# Patient Record
Sex: Male | Born: 1965 | Race: Black or African American | Hispanic: No | Marital: Married | Smoking: Never smoker
Health system: Southern US, Community
[De-identification: ages and names within clinical notes are randomized; demographics above are authoritative.]

## PROBLEM LIST (undated history)

## (undated) DIAGNOSIS — E119 Type 2 diabetes mellitus without complications: Secondary | ICD-10-CM

## (undated) DIAGNOSIS — I10 Essential (primary) hypertension: Secondary | ICD-10-CM

## (undated) HISTORY — PX: REPLACEMENT TOTAL KNEE: SUR1224

---

## 1997-11-04 ENCOUNTER — Encounter: Admission: RE | Admit: 1997-11-04 | Discharge: 1997-11-04 | Payer: Self-pay | Admitting: Family Medicine

## 1999-07-21 ENCOUNTER — Encounter: Admission: RE | Admit: 1999-07-21 | Discharge: 1999-07-21 | Payer: Self-pay | Admitting: Sports Medicine

## 1999-11-03 ENCOUNTER — Encounter: Admission: RE | Admit: 1999-11-03 | Discharge: 1999-11-03 | Payer: Self-pay | Admitting: Family Medicine

## 2000-11-18 ENCOUNTER — Encounter: Admission: RE | Admit: 2000-11-18 | Discharge: 2000-11-18 | Payer: Self-pay | Admitting: Family Medicine

## 2000-11-22 ENCOUNTER — Ambulatory Visit (HOSPITAL_COMMUNITY): Admission: RE | Admit: 2000-11-22 | Discharge: 2000-11-22 | Payer: Self-pay | Admitting: Orthopedic Surgery

## 2002-05-08 ENCOUNTER — Encounter: Admission: RE | Admit: 2002-05-08 | Discharge: 2002-05-08 | Payer: Self-pay | Admitting: Family Medicine

## 2002-05-11 ENCOUNTER — Encounter: Admission: RE | Admit: 2002-05-11 | Discharge: 2002-05-11 | Payer: Self-pay | Admitting: Family Medicine

## 2003-01-04 ENCOUNTER — Encounter: Admission: RE | Admit: 2003-01-04 | Discharge: 2003-01-04 | Payer: Self-pay | Admitting: Family Medicine

## 2003-06-28 ENCOUNTER — Encounter: Admission: RE | Admit: 2003-06-28 | Discharge: 2003-06-28 | Payer: Self-pay | Admitting: Family Medicine

## 2004-01-27 ENCOUNTER — Ambulatory Visit: Payer: Self-pay | Admitting: Sports Medicine

## 2004-10-27 ENCOUNTER — Ambulatory Visit: Payer: Self-pay | Admitting: Family Medicine

## 2004-10-27 ENCOUNTER — Ambulatory Visit (HOSPITAL_COMMUNITY): Admission: RE | Admit: 2004-10-27 | Discharge: 2004-10-27 | Payer: Self-pay | Admitting: Family Medicine

## 2005-06-08 ENCOUNTER — Ambulatory Visit: Payer: Self-pay | Admitting: Family Medicine

## 2006-04-04 DIAGNOSIS — K219 Gastro-esophageal reflux disease without esophagitis: Secondary | ICD-10-CM | POA: Insufficient documentation

## 2006-04-04 DIAGNOSIS — J309 Allergic rhinitis, unspecified: Secondary | ICD-10-CM | POA: Insufficient documentation

## 2006-04-04 DIAGNOSIS — G5600 Carpal tunnel syndrome, unspecified upper limb: Secondary | ICD-10-CM | POA: Insufficient documentation

## 2006-04-04 DIAGNOSIS — J4520 Mild intermittent asthma, uncomplicated: Secondary | ICD-10-CM

## 2006-05-02 ENCOUNTER — Telehealth: Payer: Self-pay | Admitting: Family Medicine

## 2006-06-14 ENCOUNTER — Ambulatory Visit: Payer: Self-pay | Admitting: Family Medicine

## 2006-07-19 ENCOUNTER — Encounter: Payer: Self-pay | Admitting: Family Medicine

## 2006-07-19 ENCOUNTER — Ambulatory Visit: Payer: Self-pay | Admitting: Family Medicine

## 2006-07-19 DIAGNOSIS — I10 Essential (primary) hypertension: Secondary | ICD-10-CM | POA: Insufficient documentation

## 2006-07-19 LAB — CONVERTED CEMR LAB
BUN: 12 mg/dL (ref 6–23)
CO2: 25 meq/L (ref 19–32)
Calcium: 9.1 mg/dL (ref 8.4–10.5)
Chloride: 102 meq/L (ref 96–112)
Creatinine, Ser: 0.92 mg/dL (ref 0.40–1.50)
Direct LDL: 110 mg/dL — ABNORMAL HIGH
Glucose, Bld: 103 mg/dL — ABNORMAL HIGH (ref 70–99)
Potassium: 3.9 meq/L (ref 3.5–5.3)
Sodium: 138 meq/L (ref 135–145)

## 2006-07-22 ENCOUNTER — Encounter: Payer: Self-pay | Admitting: Family Medicine

## 2006-11-19 ENCOUNTER — Telehealth (INDEPENDENT_AMBULATORY_CARE_PROVIDER_SITE_OTHER): Payer: Self-pay | Admitting: Family Medicine

## 2006-11-21 ENCOUNTER — Telehealth: Payer: Self-pay | Admitting: *Deleted

## 2006-11-22 ENCOUNTER — Ambulatory Visit (HOSPITAL_COMMUNITY): Admission: RE | Admit: 2006-11-22 | Discharge: 2006-11-22 | Payer: Self-pay | Admitting: Family Medicine

## 2006-11-22 ENCOUNTER — Ambulatory Visit: Payer: Self-pay | Admitting: Internal Medicine

## 2006-11-22 ENCOUNTER — Encounter (INDEPENDENT_AMBULATORY_CARE_PROVIDER_SITE_OTHER): Payer: Self-pay | Admitting: Family Medicine

## 2006-11-22 LAB — CONVERTED CEMR LAB
BUN: 15 mg/dL (ref 6–23)
CO2: 26 meq/L (ref 19–32)
Calcium: 9.1 mg/dL (ref 8.4–10.5)
Chloride: 101 meq/L (ref 96–112)
Creatinine, Ser: 0.86 mg/dL (ref 0.40–1.50)
Glucose, Bld: 83 mg/dL (ref 70–99)
Potassium: 3.7 meq/L (ref 3.5–5.3)
Sodium: 140 meq/L (ref 135–145)

## 2006-11-27 ENCOUNTER — Encounter (INDEPENDENT_AMBULATORY_CARE_PROVIDER_SITE_OTHER): Payer: Self-pay | Admitting: Family Medicine

## 2007-03-05 ENCOUNTER — Telehealth: Payer: Self-pay | Admitting: *Deleted

## 2007-08-07 ENCOUNTER — Ambulatory Visit: Payer: Self-pay | Admitting: Family Medicine

## 2007-10-10 ENCOUNTER — Encounter (INDEPENDENT_AMBULATORY_CARE_PROVIDER_SITE_OTHER): Payer: Self-pay | Admitting: Family Medicine

## 2007-10-10 ENCOUNTER — Ambulatory Visit: Payer: Self-pay | Admitting: Family Medicine

## 2007-10-10 LAB — CONVERTED CEMR LAB
Alkaline Phosphatase: 49 units/L (ref 39–117)
BUN: 19 mg/dL (ref 6–23)
Creatinine, Ser: 1.29 mg/dL (ref 0.40–1.50)
Glucose, Bld: 96 mg/dL (ref 70–99)
Sodium: 142 meq/L (ref 135–145)
Total Bilirubin: 0.4 mg/dL (ref 0.3–1.2)
Total Protein: 7.7 g/dL (ref 6.0–8.3)

## 2007-10-14 ENCOUNTER — Encounter (INDEPENDENT_AMBULATORY_CARE_PROVIDER_SITE_OTHER): Payer: Self-pay | Admitting: Family Medicine

## 2007-10-17 ENCOUNTER — Telehealth (INDEPENDENT_AMBULATORY_CARE_PROVIDER_SITE_OTHER): Payer: Self-pay | Admitting: Family Medicine

## 2008-10-28 ENCOUNTER — Encounter: Payer: Self-pay | Admitting: Family Medicine

## 2008-10-29 ENCOUNTER — Ambulatory Visit: Payer: Self-pay | Admitting: Family Medicine

## 2008-10-29 ENCOUNTER — Encounter: Payer: Self-pay | Admitting: Family Medicine

## 2008-10-29 LAB — CONVERTED CEMR LAB
CO2: 24 meq/L (ref 19–32)
Chloride: 99 meq/L (ref 96–112)
Glucose, Bld: 87 mg/dL (ref 70–99)
Potassium: 3.7 meq/L (ref 3.5–5.3)
Sodium: 138 meq/L (ref 135–145)

## 2008-12-03 ENCOUNTER — Ambulatory Visit: Payer: Self-pay | Admitting: Family Medicine

## 2008-12-27 ENCOUNTER — Telehealth: Payer: Self-pay | Admitting: *Deleted

## 2009-01-25 ENCOUNTER — Telehealth: Payer: Self-pay | Admitting: *Deleted

## 2009-07-15 ENCOUNTER — Ambulatory Visit: Payer: Self-pay | Admitting: Family Medicine

## 2010-03-09 NOTE — Assessment & Plan Note (Signed)
Summary: URI?,df   Vital Signs:  Patient profile:   45 year old male Height:      72 inches Weight:      316 pounds BMI:     43.01 Temp:     99.3 degrees F oral Pulse rate:   94 / minute BP sitting:   131 / 90  (left arm) Cuff size:   large  Vitals Entered By: Tessie Fass CMA (July 15, 2009 2:11 PM) CC: fever, not feeling well Is Patient Diabetic? No Pain Assessment Patient in pain? no        Primary Care Provider:  Doree Albee MD  CC:  fever and not feeling well.  History of Present Illness: 2 weeks of URI symptoms, getting better.  Initially had fever, then cough and congestion and aches.  He really did not miss work, left early to come to the doctors.  Needs asthma meds refilled.  Habits & Providers  Alcohol-Tobacco-Diet     Tobacco Status: never  Current Medications (verified): 1)  Hydrochlorothiazide 25 Mg Tabs (Hydrochlorothiazide) .... Take 1 Tablet By Mouth Once A Day 2)  Ranitidine Hcl 150 Mg  Tabs (Ranitidine Hcl) .Marland Kitchen.. 1 Tab By Mouth Two Times A Day For Heartburn 3)  Ventolin Hfa 108 (90 Base) Mcg/act Aers (Albuterol Sulfate) .Marland Kitchen.. 1-2 Inhaled Every 4 Hours As Needed For Wheezing. 4)  Flovent Hfa 110 Mcg/act Aero (Fluticasone Propionate  Hfa) .Marland Kitchen.. 1 Puff Two Times A Day Every Day For Control of Asthma- Not A Rescue Inhaler!  Allergies (verified): No Known Drug Allergies  Physical Exam  General:  Well-developed,well-nourished,in no acute distress; alert,appropriate and cooperative throughout examination Ears:  TM retracted Mouth:  white post nasal drainage Lungs:  normal respiratory effort and normal breath sounds.   Heart:  normal rate and regular rhythm.     Impression & Recommendations:  Problem # 1:  UPPER RESPIRATORY INFECTION (ICD-465.9)  almost resolved, refilled asthma meds, no additonal treatment necessary  Orders: [facility]- Est Level  3 ([address]109)  Problem # 2:  HYPERTENSION, BENIGN (ICD-401.1)  refilled His updated medication list for  this problem includes:    Hydrochlorothiazide 25 Mg Tabs (Hydrochlorothiazide) .Marland Kitchen... Take 1 tablet by mouth once a day  Orders: [facility]- Est Level  3 (60454)  Problem # 3:  GASTROESOPHAGEAL REFLUX, NO ESOPHAGITIS (ICD-530.81)  refilled His updated medication list for this problem includes:    Ranitidine Hcl 150 Mg Tabs (Ranitidine hcl) .Marland Kitchen... 1 tab by mouth two times a day for heartburn  Orders: [facility]- Est Level  3 (09811)  Complete Medication List: 1)  Hydrochlorothiazide 25 Mg Tabs (Hydrochlorothiazide) .... Take 1 tablet by mouth once a day 2)  Ranitidine Hcl 150 Mg Tabs (Ranitidine hcl) .Marland Kitchen.. 1 tab by mouth two times a day for heartburn 3)  Ventolin Hfa 108 (90 Base) Mcg/act Aers (Albuterol sulfate) .Marland Kitchen.. 1-2 inhaled every 4 hours as needed for wheezing. 4)  Flovent Hfa 110 Mcg/act Aero (Fluticasone propionate  hfa) .Marland Kitchen.. 1 puff two times a day every day for control of asthma- not a rescue inhaler!  Patient Instructions: 1)  Please schedule a follow-up appointment as needed .  Prescriptions: RANITIDINE HCL 150 MG  TABS (RANITIDINE HCL) 1 tab by mouth two times a day for heartburn  #180 x 3   Entered and Authorized by:   Luretha Murphy NP   Signed by:   Luretha Murphy NP on 07/15/2009   Method used:   [facility]ronically to  [facility]       [address]       [place], [place]  [address]       Ph: [phone]       Fax: [phone]   RxID:   [id number] HYDROCHLOROTHIAZIDE 25 MG TABS (HYDROCHLOROTHIAZIDE) Take 1 tablet by mouth once a day  #90 x 3   Entered and Authorized by:   Luretha Murphy NP   Signed by:   Luretha Murphy NP on 07/15/2009   Method used:   Electronically to        [facility] (retail)       [address]       [place], [place]  [address]       Ph: [phone]       Fax: [phone]   RxID:   [id number] FLOVENT HFA 110 MCG/ACT AERO (FLUTICASONE PROPIONATE  HFA) 1 puff two times a day every  day for control of asthma- not a rescue inhaler!  #1 x 3   Entered and Authorized by:   Luretha Murphy NP   Signed by:   Luretha Murphy NP on 07/15/2009   Method used:   Electronically to        [facility] (retail)       [address]       [place], [place]  [address]       Ph: [phone]       Fax: [phone]   RxID:   [id number] VENTOLIN HFA 108 (90 BASE) MCG/ACT AERS (ALBUTEROL SULFATE) 1-2 inhaled every 4 hours as needed for wheezing.  #1 x 3   Entered and Authorized by:   Luretha Murphy NP   Signed by:   Luretha Murphy NP on 07/15/2009   Method used:   Electronically to        [facility] (retail)       [address]       [place], [place]  [address]       Ph: [phone]       Fax: [phone]   RxID:   [id number]

## 2010-08-15 ENCOUNTER — Other Ambulatory Visit: Payer: Self-pay | Admitting: Family Medicine

## 2010-08-16 NOTE — Telephone Encounter (Signed)
Refill request

## 2010-08-18 ENCOUNTER — Telehealth: Payer: Self-pay | Admitting: Family Medicine

## 2010-08-18 NOTE — Telephone Encounter (Signed)
Pt is out of flovent, is out of town and wants to know if it can be refilled at the pharmacy he is close to, [facility] [place], [place] [phone].

## 2010-08-22 MED ORDER — FLUTICASONE PROPIONATE HFA 110 MCG/ACT IN AERO: 1.0000 | INHALATION_SPRAY | Freq: Two times a day (BID) | RESPIRATORY_TRACT | Status: DC

## 2010-08-22 NOTE — Telephone Encounter (Signed)
Pt is checking to see if he can get this flonase.  He wants it called to [facility] - [phone]

## 2010-08-22 NOTE — Telephone Encounter (Signed)
Called in with no refills.  Patient notified.

## 2010-10-12 ENCOUNTER — Other Ambulatory Visit: Payer: Self-pay | Admitting: Family Medicine

## 2010-10-12 NOTE — Telephone Encounter (Signed)
Refill request

## 2010-11-10 ENCOUNTER — Other Ambulatory Visit: Payer: Self-pay | Admitting: Family Medicine

## 2010-11-10 NOTE — Telephone Encounter (Signed)
Refill request

## 2010-11-13 NOTE — Telephone Encounter (Signed)
Pt needs appt for any additional refills

## 2011-05-09 ENCOUNTER — Other Ambulatory Visit: Payer: Self-pay | Admitting: Family Medicine

## 2011-06-15 ENCOUNTER — Ambulatory Visit (INDEPENDENT_AMBULATORY_CARE_PROVIDER_SITE_OTHER): Payer: BC Managed Care – PPO | Admitting: Family Medicine

## 2011-06-15 ENCOUNTER — Encounter: Payer: Self-pay | Admitting: Family Medicine

## 2011-06-15 VITALS — BP 152/85 | HR 106 | Temp 98.9°F | Ht 72.0 in | Wt 334.0 lb

## 2011-06-15 DIAGNOSIS — I10 Essential (primary) hypertension: Secondary | ICD-10-CM

## 2011-06-15 DIAGNOSIS — J45909 Unspecified asthma, uncomplicated: Secondary | ICD-10-CM

## 2011-06-15 MED ORDER — HYDROCHLOROTHIAZIDE 25 MG PO TABS: 25.0000 mg | ORAL_TABLET | Freq: Every day | ORAL | Status: DC

## 2011-06-15 MED ORDER — FLUTICASONE PROPIONATE HFA 110 MCG/ACT IN AERO: 1.0000 | INHALATION_SPRAY | Freq: Two times a day (BID) | RESPIRATORY_TRACT | Status: DC

## 2011-06-15 MED ORDER — LISINOPRIL 10 MG PO TABS: 10.0000 mg | ORAL_TABLET | Freq: Every day | ORAL | Status: DC

## 2011-06-15 MED ORDER — ALBUTEROL SULFATE HFA 108 (90 BASE) MCG/ACT IN AERS: 2.0000 | INHALATION_SPRAY | Freq: Four times a day (QID) | RESPIRATORY_TRACT | Status: DC | PRN

## 2011-06-15 NOTE — Patient Instructions (Signed)

## 2011-06-16 LAB — TSH: TSH: 0.739 u[IU]/mL (ref 0.350–4.500)

## 2011-06-16 LAB — COMPREHENSIVE METABOLIC PANEL
AST: 14 U/L (ref 0–37)
BUN: 18 mg/dL (ref 6–23)
Calcium: 9.7 mg/dL (ref 8.4–10.5)
Chloride: 102 mEq/L (ref 96–112)
Creat: 0.97 mg/dL (ref 0.50–1.35)

## 2011-06-16 NOTE — Assessment & Plan Note (Addendum)
Elevated today. Will add on lisinopril to regimen. Will check Cr and K. Discussed low salt diet as well as importance of more regular follow up. Handout given. Pt expressed understanding.  Given acanthois and obesity, will check risk stratification labs

## 2011-06-16 NOTE — Progress Notes (Signed)
Subjective:  Pt presents today for chronic problem. Pt has not been seen since 2010 for this.   Hypertension:   Checking BPS Daily ?:intermittently  BP Range:140s per pt  Any HA, CP, SOB?:no Any Medication Side Effects:no Medication Compliance:yes Salt intake?:high Exercise?:no Weight Loss?:no  Asthma: on flovent and albuterol. Using flovent daily. Only uses albuterol intermittently. Usually twice per week.         Review of Systems - Negative except as noted as noted above in HPI   Objective:  Current Outpatient Prescriptions  Medication Sig Dispense Refill  . albuterol (VENTOLIN HFA) 108 (90 BASE) MCG/ACT inhaler Inhale 2 puffs into the lungs every 6 (six) hours as needed for wheezing. 1-2 puffs inhaled every 4 hours as needed for wheezing.  8.5 g  3  . fluticasone (FLOVENT HFA) 110 MCG/ACT inhaler Inhale 1 puff into the lungs 2 (two) times daily.  1 Inhaler  6  . hydrochlorothiazide (HYDRODIURIL) 25 MG tablet Take 1 tablet (25 mg total) by mouth daily.  30 tablet  6  . lisinopril (PRINIVIL,ZESTRIL) 10 MG tablet Take 1 tablet (10 mg total) by mouth daily.  30 tablet  11  . ranitidine (ZANTAC) 150 MG tablet TAKE ONE TABLET BY MOUTH TWICE DAILY FOR  HEARTBURN  60 tablet  11    Wt Readings from Last 3 Encounters:  06/15/11 334 lb (151.501 kg)  07/15/09 316 lb (143.337 kg)  12/03/08 313 lb (141.976 kg)   Temp Readings from Last 3 Encounters:  06/15/11 98.9 F (37.2 C) Oral   BP Readings from Last 3 Encounters:  06/15/11 152/85  07/15/09 131/90  12/03/08 130/80   Pulse Readings from Last 3 Encounters:  06/15/11 106  07/15/09 94  12/03/08 78   General: alert, cooperative and moderately obese HEENT: PERRLA and extra ocular movement intact Heart: S1, S2 normal, no murmur, rub or gallop, regular rate and rhythm Lungs: clear to auscultation, no wheezes or rales and unlabored breathing Abdomen: abdomen is soft without significant tenderness, masses, organomegaly or  guarding Extremities: extremities normal, atraumatic, no cyanosis or edema Skin:+ acanthosis  Neurology: normal without focal findings   Assessment/Plan:

## 2011-06-16 NOTE — Assessment & Plan Note (Signed)
Well controlled on current regimen. Will continue.  

## 2011-06-20 ENCOUNTER — Telehealth: Payer: Self-pay | Admitting: Family Medicine

## 2011-06-20 ENCOUNTER — Encounter: Payer: Self-pay | Admitting: Family Medicine

## 2011-06-20 DIAGNOSIS — E785 Hyperlipidemia, unspecified: Secondary | ICD-10-CM

## 2011-06-20 DIAGNOSIS — R7303 Prediabetes: Secondary | ICD-10-CM | POA: Insufficient documentation

## 2011-06-20 MED ORDER — METFORMIN HCL 500 MG PO TABS: 500.0000 mg | ORAL_TABLET | Freq: Every day | ORAL | Status: DC

## 2011-06-20 MED ORDER — PRAVASTATIN SODIUM 40 MG PO TABS: 40.0000 mg | ORAL_TABLET | Freq: Every day | ORAL | Status: DC

## 2011-06-20 NOTE — Telephone Encounter (Signed)
Called to follow up about lab results including A1C and LDL. Discussed with pt that I would like to start him on medication for both elevated blood sugar and elevated cholesterol. Pt was agreeable to this. Pt has follow up appt in 3-4 weeks.

## 2011-07-27 ENCOUNTER — Encounter: Payer: Self-pay | Admitting: Family Medicine

## 2011-07-27 ENCOUNTER — Ambulatory Visit (INDEPENDENT_AMBULATORY_CARE_PROVIDER_SITE_OTHER): Payer: BC Managed Care – PPO | Admitting: Family Medicine

## 2011-07-27 VITALS — BP 134/80 | HR 88 | Ht 72.0 in | Wt 336.0 lb

## 2011-07-27 DIAGNOSIS — E785 Hyperlipidemia, unspecified: Secondary | ICD-10-CM

## 2011-07-27 DIAGNOSIS — I10 Essential (primary) hypertension: Secondary | ICD-10-CM

## 2011-07-27 DIAGNOSIS — R7309 Other abnormal glucose: Secondary | ICD-10-CM

## 2011-07-27 DIAGNOSIS — R7303 Prediabetes: Secondary | ICD-10-CM

## 2011-07-27 NOTE — Progress Notes (Signed)
Subjective:  Pt here for htn follow up HTN:  Checking BPS Daily ?:no; intermittently  BP Range:140s Any HA, CP, SOB?:no Any Medication Side Effects:no Medication Compliance:yes Salt intake?:decreased per pt Exercise?:intermittent  Weight Loss?:no  Lab Results  Component Value Date   HGBA1C 6.4 06/15/2011  -Currently on metformin 500mg  daily.  -Next A1C due 09/2011         Review of Systems - Negative except as noted above in HPI  Objective:  Current Outpatient Prescriptions  Medication Sig Dispense Refill  . albuterol (VENTOLIN HFA) 108 (90 BASE) MCG/ACT inhaler Inhale 2 puffs into the lungs every 6 (six) hours as needed for wheezing. 1-2 puffs inhaled every 4 hours as needed for wheezing.  8.5 g  3  . fluticasone (FLOVENT HFA) 110 MCG/ACT inhaler Inhale 1 puff into the lungs 2 (two) times daily.  1 Inhaler  6  . hydrochlorothiazide (HYDRODIURIL) 25 MG tablet Take 1 tablet (25 mg total) by mouth daily.  30 tablet  6  . lisinopril (PRINIVIL,ZESTRIL) 10 MG tablet Take 1 tablet (10 mg total) by mouth daily.  30 tablet  11  . metFORMIN (GLUCOPHAGE) 500 MG tablet Take 1 tablet (500 mg total) by mouth daily with breakfast.  180 tablet  3  . pravastatin (PRAVACHOL) 40 MG tablet Take 1 tablet (40 mg total) by mouth daily.  90 tablet  3  . ranitidine (ZANTAC) 150 MG tablet TAKE ONE TABLET BY MOUTH TWICE DAILY FOR  HEARTBURN  60 tablet  11    Wt Readings from Last 3 Encounters:  07/27/11 336 lb (152.409 kg)  06/15/11 334 lb (151.501 kg)  07/15/09 316 lb (143.337 kg)   Temp Readings from Last 3 Encounters:  06/15/11 98.9 F (37.2 C) Oral   BP Readings from Last 3 Encounters:  07/27/11 134/80  06/15/11 152/85  07/15/09 131/90   Pulse Readings from Last 3 Encounters:  07/27/11 88  06/15/11 106  07/15/09 94   General: alert, cooperative and moderately obese HEENT: PERRLA and extra ocular movement intact Heart: S1, S2 normal, no murmur, rub or gallop, regular rate and  rhythm Lungs: clear to auscultation, no wheezes or rales and unlabored breathing Abdomen: abdomen is soft without significant tenderness, masses, organomegaly or guarding Extremities: extremities normal, atraumatic, no cyanosis or edema Skin:no rashes Neurology: normal without focal findings   Assessment/Plan:

## 2011-07-27 NOTE — Patient Instructions (Signed)

## 2011-07-27 NOTE — Assessment & Plan Note (Signed)
On pravachol.  Next LDL due 12/2011

## 2011-07-27 NOTE — Assessment & Plan Note (Signed)
Continue metformin. Discussed diet and exercise.

## 2011-07-27 NOTE — Assessment & Plan Note (Signed)
Improved with HCTZ and lisinopril. Continue current regimen. WIll check Cr and K today.

## 2011-07-28 LAB — BASIC METABOLIC PANEL
CO2: 27 mEq/L (ref 19–32)
Calcium: 9.5 mg/dL (ref 8.4–10.5)
Creat: 1.03 mg/dL (ref 0.50–1.35)
Sodium: 138 mEq/L (ref 135–145)

## 2011-08-03 ENCOUNTER — Ambulatory Visit: Payer: BC Managed Care – PPO | Admitting: Family Medicine

## 2011-10-19 ENCOUNTER — Telehealth: Payer: Self-pay | Admitting: Family Medicine

## 2011-10-19 DIAGNOSIS — K219 Gastro-esophageal reflux disease without esophagitis: Secondary | ICD-10-CM

## 2011-10-19 MED ORDER — RANITIDINE HCL 150 MG PO TABS: 150.0000 mg | ORAL_TABLET | Freq: Two times a day (BID) | ORAL | Status: DC

## 2011-10-19 NOTE — Telephone Encounter (Signed)
Ranitidine Rx in Epic was from 10/12/2010.  Last office visit with Dr. Alvester Morin in 07/2011.  Will refill med x 1 and patient needs appt with new PCP.   Patient verbalized understanding.  Gaylene Brooks, RN

## 2011-10-19 NOTE — Telephone Encounter (Signed)
The Rx for Ranitidine was not received by [facility] on [address] from last Friday 9/6 and he needs it to be resent as soon as possible.

## 2011-12-21 ENCOUNTER — Encounter: Payer: Self-pay | Admitting: Family Medicine

## 2011-12-21 ENCOUNTER — Ambulatory Visit (INDEPENDENT_AMBULATORY_CARE_PROVIDER_SITE_OTHER): Payer: BC Managed Care – PPO | Admitting: Family Medicine

## 2011-12-21 VITALS — BP 136/84 | HR 94 | Ht 72.0 in | Wt 331.0 lb

## 2011-12-21 DIAGNOSIS — R7309 Other abnormal glucose: Secondary | ICD-10-CM

## 2011-12-21 DIAGNOSIS — K219 Gastro-esophageal reflux disease without esophagitis: Secondary | ICD-10-CM

## 2011-12-21 DIAGNOSIS — I10 Essential (primary) hypertension: Secondary | ICD-10-CM

## 2011-12-21 DIAGNOSIS — N529 Male erectile dysfunction, unspecified: Secondary | ICD-10-CM

## 2011-12-21 DIAGNOSIS — E785 Hyperlipidemia, unspecified: Secondary | ICD-10-CM

## 2011-12-21 DIAGNOSIS — R7303 Prediabetes: Secondary | ICD-10-CM

## 2011-12-21 LAB — POCT GLYCOSYLATED HEMOGLOBIN (HGB A1C): Hemoglobin A1C: 6.3

## 2011-12-21 MED ORDER — SILDENAFIL CITRATE 100 MG PO TABS: 50.0000 mg | ORAL_TABLET | Freq: Every day | ORAL | Status: DC | PRN

## 2011-12-21 MED ORDER — HYDROCHLOROTHIAZIDE 25 MG PO TABS: 25.0000 mg | ORAL_TABLET | Freq: Every day | ORAL | Status: DC

## 2011-12-21 MED ORDER — FLUTICASONE PROPIONATE HFA 110 MCG/ACT IN AERO: 1.0000 | INHALATION_SPRAY | Freq: Two times a day (BID) | RESPIRATORY_TRACT | Status: DC

## 2011-12-21 MED ORDER — DICLOFENAC SODIUM 75 MG PO TBEC: 75.0000 mg | DELAYED_RELEASE_TABLET | Freq: Two times a day (BID) | ORAL | Status: DC

## 2011-12-21 MED ORDER — RANITIDINE HCL 150 MG PO TABS: 150.0000 mg | ORAL_TABLET | Freq: Two times a day (BID) | ORAL | Status: DC

## 2011-12-21 NOTE — Patient Instructions (Signed)
Sildenafil tablets (Viagra) What is this medicine? SILDENAFIL (sil DEN a fil) is used to treat erection problems in men. This medicine may be used for other purposes; ask your health care provider or pharmacist if you have questions. What should I tell my health care provider before I take this medicine? They need to know if you have any of these conditions: -bleeding disorders -eye or vision problems, including a rare inherited eye disease called retinitis pigmentosa -anatomical deformation of the penis, Peyronie's disease, or history of priapism (painful and prolonged erection) -heart disease, angina, a history of heart attack, irregular heart beats, or other heart problems -high or low blood pressure -history of blood diseases, like sickle cell anemia or leukemia -history of stomach bleeding -kidney disease -liver disease -stroke -an unusual or allergic reaction to sildenafil, other medicines, foods, dyes, or preservatives -pregnant or trying to get pregnant -breast-feeding How should I use this medicine? Take this medicine by mouth with a glass of water. Follow the directions on the prescription label. The dose is usually taken 1 hour before sexual activity. You should not take the dose more than once per day. Do not take your medicine more often than directed. Talk to your pediatrician regarding the use of this medicine in children. This medicine is not used in children for this condition. Overdosage: If you think you have taken too much of this medicine contact a poison control center or emergency room at once. NOTE: This medicine is only for you. Do not share this medicine with others. What if I miss a dose? This does not apply. Do not take double or extra doses. What may interact with this medicine? Do not take this medicine with any of the following medications: -cisapride -methscopolamine nitrate -nitrates like amyl nitrite, isosorbide dinitrate, isosorbide mononitrate,  nitroglycerin -nitroprusside -other medicines for erectile dysfunction like avanafil, tadalafil, vardenafil -other sildenafil products (Revatio)  This medicine may also interact with the following medications: -certain drugs for high blood pressure -certain drugs for the treatment of HIV infection or AIDS -certain drugs used for fungal or yeast infections, like fluconazole, itraconazole, ketoconazole, and voriconazole -cimetidine -erythromycin -rifampin This list may not describe all possible interactions. Give your health care provider a list of all the medicines, herbs, non-prescription drugs, or dietary supplements you use. Also tell them if you smoke, drink alcohol, or use illegal drugs. Some items may interact with your medicine. What should I watch for while using this medicine? If you notice any changes in your vision while taking this drug, call your doctor or health care professional as soon as possible. Stop using this medicine and call your health care provider right away if you have a loss of sight in one or both eyes. Contact you doctor or health care professional right away if the erection lasts longer than 4 hours or if it becomes painful. This may be a sign of serious problem and must be treated right away to prevent permanent damage. If you experience symptoms of nausea, dizziness, chest pain or arm pain upon initiation of sexual activity after taking this medicine, you should refrain from further activity and call your doctor or health care professional as soon as possible. Do not drink alcohol to excess (examples, 5 glasses of wine or 5 shots of whiskey) when taking this medicine. When taken in excess, alcohol can increase your chances of getting a headache or getting dizzy, increasing your heart rate or lowering your blood pressure. Using this medicine does not protect you  or your partner against HIV infection (the virus that causes AIDS) or other sexually transmitted  diseases. What side effects may I notice from receiving this medicine? Side effects that you should report to your doctor or health care professional as soon as possible: -allergic reactions like skin rash, itching or hives, swelling of the face, lips, or tongue -breathing problems -changes in hearing -changes in vision -chest pain -erection lasting more than 4 hours -fast, irregular heartbeat -seizures  Side effects that usually do not require medical attention (report to your doctor or health care professional if they continue or are bothersome): -back pain -dizziness -flushing -headache -indigestion -muscle aches -nausea -stuffy or runny nose This list may not describe all possible side effects. Call your doctor for medical advice about side effects. You may report side effects to FDA at 1-800-FDA-1088. Where should I keep my medicine? Keep out of reach of children. Store at room temperature between 15 and 30 degrees C (59 and 86 degrees F). Throw away any unused medicine after the expiration date. NOTE: This sheet is a summary. It may not cover all possible information. If you have questions about this medicine, talk to your doctor, pharmacist, or health care provider.  2012, [employer] Standard. (07/18/2010 1:17:06 PM)

## 2011-12-22 LAB — COMPLETE METABOLIC PANEL WITH GFR
ALT: 19 U/L (ref 0–53)
AST: 13 U/L (ref 0–37)
Albumin: 4.3 g/dL (ref 3.5–5.2)
Alkaline Phosphatase: 48 U/L (ref 39–117)
BUN: 17 mg/dL (ref 6–23)
Calcium: 9.2 mg/dL (ref 8.4–10.5)
Chloride: 100 mEq/L (ref 96–112)
Potassium: 4 mEq/L (ref 3.5–5.3)
Sodium: 135 mEq/L (ref 135–145)

## 2011-12-22 LAB — LDL CHOLESTEROL, DIRECT: Direct LDL: 113 mg/dL — ABNORMAL HIGH

## 2011-12-24 NOTE — Progress Notes (Signed)
Patient ID: Bryan Simmons    DOB: Sep 16, 1965, 46 y.o.   MRN: [id number] --- Subjective:  Bryan Simmons is a 46 y.o.male who presents for med refills. Patient's main concern is his inability to sustain erection for longer than a few seconds. He feels like this sarted when he first started lisinopril and HCTz. He reports still having sex drive and denies this ever happening to him before.   - HTN: takes CTZ and lisinopril as prescribed without side effects. Checks his BP at home and it runs in the 130's/80's. He denies any chest pain, any shortness of breath, any lower extremity swelling.   - pre-diabetes: was diagnosed with pre-diabetes for which he was started on metformin. He denies any change in vision, loss of sensation, ulcers, polydipsia, polyuria.   ROS: see HPI Past Medical History: reviewed and updated medications and allergies. Social History: Tobacco: denies  Objective: Filed Vitals:   12/21/11 1347  BP: 136/84  Pulse: 94    Physical Examination:   General appearance - alert, well appearing, and in no distress Chest - clear to auscultation, no wheezes, rales or rhonchi, symmetric air entry Heart - normal rate, regular rhythm, normal S1, S2, no murmurs, rubs, clicks or gallops Abdomen - soft, nontender, nondistended, no masses or organomegaly Extremities - peripheral pulses normal, no pedal edema

## 2011-12-25 DIAGNOSIS — N529 Male erectile dysfunction, unspecified: Secondary | ICD-10-CM | POA: Insufficient documentation

## 2011-12-25 NOTE — Assessment & Plan Note (Signed)
Direct LDL: 113. Goal for prediabetes ideally less than 100. Consider switching to more potent statin like atorvastatin for better results. However, patient trying to loose weight and may have success with diet modifications.

## 2011-12-25 NOTE — Assessment & Plan Note (Signed)
Unlikely due to diabetes given that patient is only pre-diabetic. Likely idiopathic. No history of heart disease or nitrate use which would put him at risk of starting phosphodiesterase inhibitor. Will start viagra. HOwever, did warn patient that patient's weight puts him at risk of cardiovascular event and that if he gains weight as opposed to continuing to loose, will need to reconsider viagra prescription. Patient expressed understanding.

## 2011-12-25 NOTE — Assessment & Plan Note (Signed)
Controled. Refilled lisnopril 10mg  and HCTZ 25mg . Obtain BMP to assess electrolytes.

## 2011-12-25 NOTE — Assessment & Plan Note (Signed)
A1C: 6.3. Continue metformin 500mg  daily

## 2011-12-27 ENCOUNTER — Telehealth: Payer: Self-pay | Admitting: Family Medicine

## 2011-12-27 DIAGNOSIS — E785 Hyperlipidemia, unspecified: Secondary | ICD-10-CM

## 2011-12-27 MED ORDER — PRAVASTATIN SODIUM 80 MG PO TABS: 80.0000 mg | ORAL_TABLET | Freq: Every day | ORAL | Status: DC

## 2011-12-27 NOTE — Telephone Encounter (Signed)
Called patient and left message on his phone letting him know of his LDL result. Not at goal below 100. Will increase pravastatin dose. Encouraged continued diet and exercise. If this doesn't decrease LDL, will switch to lipitor. Patient to follow up in 3 months.

## 2012-05-07 ENCOUNTER — Telehealth: Payer: Self-pay | Admitting: Family Medicine

## 2012-05-07 DIAGNOSIS — K219 Gastro-esophageal reflux disease without esophagitis: Secondary | ICD-10-CM

## 2012-05-07 MED ORDER — RANITIDINE HCL 150 MG PO TABS: 150.0000 mg | ORAL_TABLET | Freq: Two times a day (BID) | ORAL | Status: DC

## 2012-05-07 NOTE — Telephone Encounter (Signed)
Pt has been having a hard time getting his refill on his Zantac - pharmacy states they have sent several requests - needs to know if this can be called in.  Has appt on 4/25 [facility]

## 2012-05-07 NOTE — Telephone Encounter (Signed)
Called and informed patient I sent in refill to his pharmacy.Dorn Hartshorne, Rodena Medin

## 2012-05-30 ENCOUNTER — Ambulatory Visit: Payer: BC Managed Care – PPO | Admitting: Family Medicine

## 2012-07-11 ENCOUNTER — Ambulatory Visit: Payer: BC Managed Care – PPO | Admitting: Family Medicine

## 2012-07-18 ENCOUNTER — Ambulatory Visit (INDEPENDENT_AMBULATORY_CARE_PROVIDER_SITE_OTHER): Payer: BC Managed Care – PPO | Admitting: Family Medicine

## 2012-07-18 ENCOUNTER — Encounter: Payer: Self-pay | Admitting: Family Medicine

## 2012-07-18 VITALS — BP 125/85 | HR 88 | Ht 72.0 in | Wt 318.8 lb

## 2012-07-18 DIAGNOSIS — K219 Gastro-esophageal reflux disease without esophagitis: Secondary | ICD-10-CM

## 2012-07-18 DIAGNOSIS — R7309 Other abnormal glucose: Secondary | ICD-10-CM

## 2012-07-18 DIAGNOSIS — E785 Hyperlipidemia, unspecified: Secondary | ICD-10-CM

## 2012-07-18 DIAGNOSIS — N529 Male erectile dysfunction, unspecified: Secondary | ICD-10-CM

## 2012-07-18 DIAGNOSIS — I10 Essential (primary) hypertension: Secondary | ICD-10-CM

## 2012-07-18 DIAGNOSIS — R7303 Prediabetes: Secondary | ICD-10-CM

## 2012-07-18 LAB — POCT GLYCOSYLATED HEMOGLOBIN (HGB A1C): Hemoglobin A1C: 6.1

## 2012-07-18 MED ORDER — PRAVASTATIN SODIUM 80 MG PO TABS: 80.0000 mg | ORAL_TABLET | Freq: Every day | ORAL | Status: DC

## 2012-07-18 MED ORDER — DICLOFENAC SODIUM 75 MG PO TBEC: 75.0000 mg | DELAYED_RELEASE_TABLET | Freq: Two times a day (BID) | ORAL | Status: DC

## 2012-07-18 MED ORDER — RANITIDINE HCL 150 MG PO TABS: 150.0000 mg | ORAL_TABLET | Freq: Two times a day (BID) | ORAL | Status: DC

## 2012-07-18 MED ORDER — LISINOPRIL 10 MG PO TABS: 10.0000 mg | ORAL_TABLET | Freq: Every day | ORAL | Status: DC

## 2012-07-18 MED ORDER — HYDROCHLOROTHIAZIDE 25 MG PO TABS: 25.0000 mg | ORAL_TABLET | Freq: Every day | ORAL | Status: DC

## 2012-07-18 MED ORDER — SILDENAFIL CITRATE 100 MG PO TABS: 50.0000 mg | ORAL_TABLET | Freq: Every day | ORAL | Status: DC | PRN

## 2012-07-18 NOTE — Patient Instructions (Addendum)
When you come back in November (5 months from now), come for an early appointment fasting (no food or drink other than water after midnight) and we'll do blood work at that time.

## 2012-07-20 NOTE — Assessment & Plan Note (Signed)
On lisiopril 10mg  and hctz 25mg  daily. Well controled. Continue both.  Will obtain yearly BMP in NOvember. Patient to follow up in 5 months.

## 2012-07-20 NOTE — Progress Notes (Signed)
Patient ID: Bryan Simmons    DOB: 1965/06/12, 47 y.o.   MRN: [id number] --- Subjective:  Bryan Simmons is a 47 y.o.male who presents for follow up on HTN and pre-diabetes.  - prediabetes: taking metformin 500mg  daily. Also has lost 18 lbs since last June. He reports eating more salads and not drinking any sodas or sweet drinks. He has not started exercise program but plans on it in the near future.  He denies any change in vision, lower extremity numbness or tingling. No polydypsia or polyuria.   - HTN: takes hctz 25mg  daily and lisinopril 10mg  daily. When he checks BP at pharmacy: runs in 120's No chest pain, no shortness of breath, no lower extremity swelling, lays flat.    ROS: see HPI Past Medical History: reviewed and updated medications and allergies. Social History: Tobacco: denies   Objective: Filed Vitals:   07/18/12 1117  BP: 125/85  Pulse: 88    Physical Examination:   General appearance - alert, well appearing, and in no distress Ears - bilateral TM's and external ear canals normal Nose - normal and patent, mildly congested nasal turbinates bilaterally Mouth - mucous membranes moist, pharynx normal without lesions Neck - supple, no significant adenopathy Chest - clear to auscultation, no wheezes, rales or rhonchi, symmetric air entry Heart - normal rate, regular rhythm, normal S1, S2, no murmurs, rubs, clicks or gallops Abdomen - soft, nontender, nondistended, no masses or organomegaly Extremities - peripheral pulses normal, no pedal edema

## 2012-07-20 NOTE — Assessment & Plan Note (Signed)
A1C improved with diet and weight loss. To 6.1 from 6.4. Continue metformin and diet and exercise. Repeat A1c at next visit in 5-6 months.

## 2012-07-20 NOTE — Assessment & Plan Note (Signed)
On pravachol 80mg  daily which he tolerates well. obtain fasting lipid panel in 5 months.

## 2012-07-20 NOTE — Assessment & Plan Note (Signed)
Controled with viagra. No side effects (chest pain or SOB with use). Refilled 10tabs 1refil

## 2012-08-07 ENCOUNTER — Encounter: Payer: Self-pay | Admitting: Family Medicine

## 2012-09-17 ENCOUNTER — Encounter: Payer: Self-pay | Admitting: Family Medicine

## 2012-09-18 ENCOUNTER — Encounter: Payer: Self-pay | Admitting: Family Medicine

## 2012-09-19 ENCOUNTER — Telehealth: Payer: Self-pay | Admitting: Family Medicine

## 2012-09-19 MED ORDER — PRAVASTATIN SODIUM 40 MG PO TABS: 80.0000 mg | ORAL_TABLET | Freq: Every day | ORAL | Status: DC

## 2012-09-19 NOTE — Telephone Encounter (Signed)
Refilled pravastatin 40mg  2 tablets every day, 90 tabs 3 refills.   Marena Chancy, PGY-3 Family Medicine Resident

## 2012-09-19 NOTE — Telephone Encounter (Signed)
Bryan Simmons is requesting his Prevastatin be reduced from 80mg  to 40mg .   This is for the cost of medication. It's time for renewing the refill, so he want the new rx to be for the 40.

## 2012-09-19 NOTE — Telephone Encounter (Signed)
Left message for pt - why does med need to be decreased? Due to cost? Need refill? Please clarify. Wyatt Haste, RN-BSN

## 2012-09-19 NOTE — Telephone Encounter (Signed)
Pt reports that 80 mg tablets are triple the price. can he have script for 40 mg twice a day? Wyatt Haste, RN-BSN

## 2012-09-23 ENCOUNTER — Other Ambulatory Visit: Payer: Self-pay | Admitting: Family Medicine

## 2012-09-23 MED ORDER — PRAVASTATIN SODIUM 40 MG PO TABS: 40.0000 mg | ORAL_TABLET | Freq: Every day | ORAL | Status: DC

## 2012-10-15 ENCOUNTER — Encounter: Payer: Self-pay | Admitting: Family Medicine

## 2012-10-22 ENCOUNTER — Telehealth: Payer: Self-pay | Admitting: Family Medicine

## 2012-10-22 DIAGNOSIS — R7303 Prediabetes: Secondary | ICD-10-CM

## 2012-10-22 MED ORDER — METFORMIN HCL 500 MG PO TABS: 500.0000 mg | ORAL_TABLET | Freq: Every day | ORAL | Status: DC

## 2012-10-22 NOTE — Telephone Encounter (Signed)
Will fwd. To Dr.Losq for refills. Bryan Simmons, [name]

## 2012-10-22 NOTE — Telephone Encounter (Signed)
Patient states that he requested a refill for his metFormin last Tuesday and has not received a response. He needs a refill sent to pharmacy. Also, patient states he works out of town and will not return until Friday. Will be out of meds theses three days and would to know if Dr. Gwenlyn Saran feels this will be harmful to him? Pls call patient

## 2012-10-22 NOTE — Telephone Encounter (Signed)
Called patient back and apologized for the miscommunication between the pharmacy and the clinic.  I will refill the metformin 500mg  daily. I also told him that missing a few doses of metformin is not of great concern since his A1C was 6.1 at his last visit.  Patient expressed understanding and agreed with plan.   Marena Chancy, PGY-3 Family Medicine Resident

## 2012-12-11 ENCOUNTER — Other Ambulatory Visit: Payer: Self-pay

## 2013-01-22 ENCOUNTER — Telehealth: Payer: Self-pay | Admitting: Family Medicine

## 2013-01-22 DIAGNOSIS — K219 Gastro-esophageal reflux disease without esophagitis: Secondary | ICD-10-CM

## 2013-01-22 MED ORDER — RANITIDINE HCL 150 MG PO TABS: 150.0000 mg | ORAL_TABLET | Freq: Two times a day (BID) | ORAL | Status: DC

## 2013-01-22 NOTE — Telephone Encounter (Signed)
Refilled zantac  Marena Chancy, PGY-3 Family Medicine Resident

## 2013-01-22 NOTE — Telephone Encounter (Signed)
Pt is calling and needs a refill on his Zantac sent to his pharmacy on file. jw

## 2013-03-20 ENCOUNTER — Telehealth: Payer: Self-pay | Admitting: Family Medicine

## 2013-03-20 MED ORDER — HYDROCHLOROTHIAZIDE 25 MG PO TABS: 25.0000 mg | ORAL_TABLET | Freq: Every day | ORAL | Status: DC

## 2013-03-20 NOTE — Telephone Encounter (Signed)
Will fwd. To PCP. Last OV 6/14 .Arlyss RepressSlade, Ahsha Hinsley

## 2013-03-20 NOTE — Telephone Encounter (Signed)
Refill sent for hctz.   Marena ChancyStephanie Elius Etheredge, PGY-3 Family Medicine Resident

## 2013-03-20 NOTE — Telephone Encounter (Signed)
Pt called and needs a refill on his hydrochlorothiazide sent in to the pharmacy. jw

## 2013-03-23 ENCOUNTER — Other Ambulatory Visit: Payer: Self-pay | Admitting: *Deleted

## 2013-03-23 MED ORDER — HYDROCHLOROTHIAZIDE 25 MG PO TABS: 25.0000 mg | ORAL_TABLET | Freq: Every day | ORAL | Status: DC

## 2013-05-25 ENCOUNTER — Other Ambulatory Visit: Payer: Self-pay | Admitting: Family Medicine

## 2013-05-26 ENCOUNTER — Other Ambulatory Visit: Payer: Self-pay | Admitting: *Deleted

## 2013-05-26 DIAGNOSIS — K219 Gastro-esophageal reflux disease without esophagitis: Secondary | ICD-10-CM

## 2013-05-27 MED ORDER — RANITIDINE HCL 150 MG PO TABS
150.0000 mg | ORAL_TABLET | Freq: Two times a day (BID) | ORAL | Status: DC
Start: ? — End: 2013-06-24

## 2013-06-23 ENCOUNTER — Other Ambulatory Visit: Payer: Self-pay | Admitting: Family Medicine

## 2013-06-23 ENCOUNTER — Encounter: Payer: Self-pay | Admitting: Family Medicine

## 2013-06-24 ENCOUNTER — Other Ambulatory Visit: Payer: Self-pay | Admitting: Family Medicine

## 2013-06-24 DIAGNOSIS — K219 Gastro-esophageal reflux disease without esophagitis: Secondary | ICD-10-CM

## 2013-06-24 MED ORDER — RANITIDINE HCL 150 MG PO TABS
150.0000 mg | ORAL_TABLET | Freq: Two times a day (BID) | ORAL | Status: DC
Start: 2013-06-24 — End: 2013-10-22

## 2013-09-02 ENCOUNTER — Encounter: Payer: Self-pay | Admitting: Family Medicine

## 2013-09-02 DIAGNOSIS — N529 Male erectile dysfunction, unspecified: Secondary | ICD-10-CM

## 2013-09-04 MED ORDER — SILDENAFIL CITRATE 100 MG PO TABS: 50.0000 mg | ORAL_TABLET | Freq: Every day | ORAL | Status: DC | PRN

## 2013-09-24 ENCOUNTER — Encounter: Payer: Self-pay | Admitting: Family Medicine

## 2013-09-29 ENCOUNTER — Other Ambulatory Visit: Payer: Self-pay | Admitting: *Deleted

## 2013-09-29 MED ORDER — DICLOFENAC SODIUM 75 MG PO TBEC: 75.0000 mg | DELAYED_RELEASE_TABLET | Freq: Two times a day (BID) | ORAL | Status: DC

## 2013-10-01 ENCOUNTER — Encounter: Payer: Self-pay | Admitting: Family Medicine

## 2013-10-02 ENCOUNTER — Telehealth: Payer: Self-pay | Admitting: Family Medicine

## 2013-10-02 NOTE — Telephone Encounter (Signed)
Refill request for pravastatin

## 2013-10-05 ENCOUNTER — Other Ambulatory Visit: Payer: Self-pay | Admitting: *Deleted

## 2013-10-06 MED ORDER — PRAVASTATIN SODIUM 40 MG PO TABS: 40.0000 mg | ORAL_TABLET | Freq: Every day | ORAL | Status: DC

## 2013-10-21 ENCOUNTER — Encounter: Payer: Self-pay | Admitting: Family Medicine

## 2013-10-22 ENCOUNTER — Other Ambulatory Visit: Payer: Self-pay | Admitting: Family Medicine

## 2013-10-22 DIAGNOSIS — K219 Gastro-esophageal reflux disease without esophagitis: Secondary | ICD-10-CM

## 2013-10-22 MED ORDER — RANITIDINE HCL 150 MG PO TABS: 150.0000 mg | ORAL_TABLET | Freq: Two times a day (BID) | ORAL | Status: DC

## 2013-10-22 MED ORDER — HYDROCHLOROTHIAZIDE 25 MG PO TABS: 25.0000 mg | ORAL_TABLET | Freq: Every day | ORAL | Status: DC

## 2013-12-16 ENCOUNTER — Encounter: Payer: BC Managed Care – PPO | Admitting: Family Medicine

## 2013-12-16 DIAGNOSIS — R7303 Prediabetes: Secondary | ICD-10-CM

## 2013-12-16 MED ORDER — METFORMIN HCL 500 MG PO TABS: 500.0000 mg | ORAL_TABLET | Freq: Every day | ORAL | Status: DC

## 2013-12-16 NOTE — Progress Notes (Signed)
Refilled metformin

## 2014-01-25 ENCOUNTER — Encounter: Payer: BC Managed Care – PPO | Admitting: Family Medicine

## 2014-02-26 ENCOUNTER — Encounter: Payer: BC Managed Care – PPO | Admitting: Family Medicine

## 2014-03-19 ENCOUNTER — Encounter: Payer: Self-pay | Admitting: Family Medicine

## 2014-03-19 ENCOUNTER — Ambulatory Visit (INDEPENDENT_AMBULATORY_CARE_PROVIDER_SITE_OTHER): Payer: BLUE CROSS/BLUE SHIELD | Admitting: Family Medicine

## 2014-03-19 VITALS — BP 138/84 | HR 87 | Temp 98.1°F | Ht 72.0 in | Wt 324.3 lb

## 2014-03-19 DIAGNOSIS — M25561 Pain in right knee: Secondary | ICD-10-CM | POA: Diagnosis not present

## 2014-03-19 DIAGNOSIS — R7309 Other abnormal glucose: Secondary | ICD-10-CM

## 2014-03-19 DIAGNOSIS — M25562 Pain in left knee: Secondary | ICD-10-CM

## 2014-03-19 DIAGNOSIS — R7303 Prediabetes: Secondary | ICD-10-CM

## 2014-03-19 DIAGNOSIS — I10 Essential (primary) hypertension: Secondary | ICD-10-CM

## 2014-03-19 DIAGNOSIS — E785 Hyperlipidemia, unspecified: Secondary | ICD-10-CM

## 2014-03-19 LAB — LDL CHOLESTEROL, DIRECT: Direct LDL: 98 mg/dL

## 2014-03-19 LAB — POCT GLYCOSYLATED HEMOGLOBIN (HGB A1C): HEMOGLOBIN A1C: 6

## 2014-03-19 MED ORDER — METFORMIN HCL 500 MG PO TABS: 500.0000 mg | ORAL_TABLET | Freq: Every day | ORAL | Status: DC

## 2014-03-19 MED ORDER — DICLOFENAC SODIUM 75 MG PO TBEC: 75.0000 mg | DELAYED_RELEASE_TABLET | Freq: Two times a day (BID) | ORAL | Status: DC

## 2014-03-19 NOTE — Patient Instructions (Signed)
We will call with the results of your blood work early next week. I have refilled your metformin and diclofenac. Please come back and see me in 6 months or sooner if you have any problems.

## 2014-03-21 NOTE — Assessment & Plan Note (Signed)
A1c improved with metformin and lifestyle mods to 6.0 - continue current treatment - f/u in 6 months

## 2014-03-21 NOTE — Progress Notes (Signed)
   Subjective:    Patient ID: Bryan Simmons, male    DOB: Jun 18, 1965, 49 y.o.   MRN: [id number]  HPI Pt presents for f/u of prediabetes and knee pain.   Occasional diclofenac relieves knee pain well. Needs refill  PREDIABETES  Disease Monitoring  Blood Sugar Ranges: does not check  Polyuria: no   Visual problems: no   Medication Compliance: yes, metformin bid  Medication Side Effects  Hypoglycemia: no    Review of Systems See HPI    Objective:   Physical Exam  Constitutional: He is oriented to person, place, and time. He appears well-developed and well-nourished. No distress.  HENT:  Head: Normocephalic and atraumatic.  Eyes: Conjunctivae are normal. Right eye exhibits no discharge. Left eye exhibits no discharge. No scleral icterus.  Cardiovascular: Normal rate, regular rhythm and normal heart sounds.   No murmur heard. Pulmonary/Chest: Effort normal and breath sounds normal. No respiratory distress. He has no wheezes.  Abdominal: Soft. Bowel sounds are normal. He exhibits no distension. There is no tenderness.  Musculoskeletal: Normal range of motion. He exhibits no edema or tenderness.  Neurological: He is alert and oriented to person, place, and time.  Skin: Skin is warm and dry. He is not diaphoretic.  Psychiatric: He has a normal mood and affect. His behavior is normal.  Nursing note and vitals reviewed.         Assessment & Plan:

## 2014-03-21 NOTE — Assessment & Plan Note (Signed)
Chronic knee pain well controlled on diclofenac - refill nsaid

## 2014-03-21 NOTE — Assessment & Plan Note (Signed)
Improved LDL to 98 - continue pravachol

## 2014-03-24 ENCOUNTER — Telehealth: Payer: Self-pay | Admitting: *Deleted

## 2014-03-24 NOTE — Telephone Encounter (Signed)
-----   Message from Abram SanderElena M Adamo, MD sent at 03/20/2014 12:19 AM EST ----- Please let patient note that his labs are better/normal and he should continue his blood sugar and cholesterol medications as he has been.

## 2014-03-24 NOTE — Telephone Encounter (Signed)
Pt informed. Deseree Blount, CMA  

## 2014-06-23 ENCOUNTER — Other Ambulatory Visit: Payer: Self-pay | Admitting: Family Medicine

## 2014-06-23 ENCOUNTER — Encounter: Payer: Self-pay | Admitting: Family Medicine

## 2014-06-23 DIAGNOSIS — J4520 Mild intermittent asthma, uncomplicated: Secondary | ICD-10-CM

## 2014-06-23 MED ORDER — ALBUTEROL SULFATE HFA 108 (90 BASE) MCG/ACT IN AERS: INHALATION_SPRAY | RESPIRATORY_TRACT | Status: DC

## 2014-10-08 ENCOUNTER — Encounter: Payer: Self-pay | Admitting: Family Medicine

## 2014-10-08 ENCOUNTER — Other Ambulatory Visit: Payer: Self-pay | Admitting: Family Medicine

## 2014-10-08 DIAGNOSIS — E785 Hyperlipidemia, unspecified: Secondary | ICD-10-CM

## 2014-10-29 ENCOUNTER — Other Ambulatory Visit: Payer: Self-pay | Admitting: Family Medicine

## 2014-10-29 ENCOUNTER — Encounter: Payer: Self-pay | Admitting: Family Medicine

## 2014-10-29 DIAGNOSIS — K219 Gastro-esophageal reflux disease without esophagitis: Secondary | ICD-10-CM

## 2014-10-29 DIAGNOSIS — I10 Essential (primary) hypertension: Secondary | ICD-10-CM

## 2015-03-04 ENCOUNTER — Other Ambulatory Visit: Payer: Self-pay | Admitting: Family Medicine

## 2015-03-31 ENCOUNTER — Other Ambulatory Visit: Payer: Self-pay | Admitting: Family Medicine

## 2015-03-31 DIAGNOSIS — E119 Type 2 diabetes mellitus without complications: Secondary | ICD-10-CM

## 2015-04-05 ENCOUNTER — Other Ambulatory Visit: Payer: Self-pay | Admitting: Family Medicine

## 2015-04-05 ENCOUNTER — Encounter: Payer: Self-pay | Admitting: Family Medicine

## 2015-04-05 DIAGNOSIS — E119 Type 2 diabetes mellitus without complications: Secondary | ICD-10-CM

## 2015-04-06 MED ORDER — METFORMIN HCL 500 MG PO TABS: 500.0000 mg | ORAL_TABLET | Freq: Every day | ORAL | Status: DC

## 2015-04-29 ENCOUNTER — Encounter: Payer: Self-pay | Admitting: Family Medicine

## 2015-04-29 ENCOUNTER — Ambulatory Visit (INDEPENDENT_AMBULATORY_CARE_PROVIDER_SITE_OTHER): Payer: BLUE CROSS/BLUE SHIELD | Admitting: Family Medicine

## 2015-04-29 VITALS — BP 136/81 | HR 95 | Temp 98.1°F | Ht 72.0 in | Wt 333.2 lb

## 2015-04-29 DIAGNOSIS — I10 Essential (primary) hypertension: Secondary | ICD-10-CM

## 2015-04-29 DIAGNOSIS — K219 Gastro-esophageal reflux disease without esophagitis: Secondary | ICD-10-CM | POA: Diagnosis not present

## 2015-04-29 DIAGNOSIS — E785 Hyperlipidemia, unspecified: Secondary | ICD-10-CM

## 2015-04-29 DIAGNOSIS — R7303 Prediabetes: Secondary | ICD-10-CM

## 2015-04-29 DIAGNOSIS — M25561 Pain in right knee: Secondary | ICD-10-CM

## 2015-04-29 DIAGNOSIS — Z114 Encounter for screening for human immunodeficiency virus [HIV]: Secondary | ICD-10-CM

## 2015-04-29 DIAGNOSIS — M25562 Pain in left knee: Secondary | ICD-10-CM

## 2015-04-29 LAB — POCT GLYCOSYLATED HEMOGLOBIN (HGB A1C): HEMOGLOBIN A1C: 5.9

## 2015-04-29 MED ORDER — DICLOFENAC SODIUM 75 MG PO TBEC: 75.0000 mg | DELAYED_RELEASE_TABLET | Freq: Two times a day (BID) | ORAL | Status: DC | PRN

## 2015-04-29 NOTE — Patient Instructions (Signed)

## 2015-04-30 LAB — BASIC METABOLIC PANEL WITH GFR
BUN: 17 mg/dL (ref 7–25)
CHLORIDE: 100 mmol/L (ref 98–110)
CO2: 23 mmol/L (ref 20–31)
CREATININE: 0.89 mg/dL (ref 0.70–1.33)
Calcium: 9.9 mg/dL (ref 8.6–10.3)
GFR, Est African American: >89 mL/min (ref >=60)
GFR, Est Non African American: >89 mL/min (ref >=60)
Glucose, Bld: 84 mg/dL (ref 65–99)
POTASSIUM: 3.8 mmol/L (ref 3.5–5.3)
SODIUM: 140 mmol/L (ref 135–146)

## 2015-04-30 LAB — LIPID PANEL
Cholesterol: 165 mg/dL (ref 125–200)
HDL: 48 mg/dL (ref >=40)
LDL Cholesterol: 97 mg/dL (ref <130)
TRIGLYCERIDES: 101 mg/dL (ref <150)
Total CHOL/HDL Ratio: 3.4 Ratio (ref <=5.0)
VLDL: 20 mg/dL (ref <30)

## 2015-04-30 LAB — HIV ANTIBODY (ROUTINE TESTING W REFLEX): HIV 1&2 Ab, 4th Generation: NONREACTIVE

## 2015-05-02 ENCOUNTER — Telehealth: Payer: Self-pay | Admitting: *Deleted

## 2015-05-02 NOTE — Telephone Encounter (Signed)
-----   Message from Abram SanderElena M Adamo, MD sent at 05/02/2015 12:23 PM EDT ----- Normal labs, please inform patient. Thanks!

## 2015-05-02 NOTE — Assessment & Plan Note (Signed)
Mild, relieved by current meds - continue voltaren prn

## 2015-05-02 NOTE — Telephone Encounter (Signed)
Pt informed. Bryan Simmons, CMA  

## 2015-05-02 NOTE — Assessment & Plan Note (Addendum)
Well controlled, A1c 5.9 today - continue metformin - encouraged low carb diet and increase physical activity - f/u in 1 year

## 2015-05-02 NOTE — Assessment & Plan Note (Signed)
Well controlled - continue lisinopril HCTZ - check BMP, lipids today - f/u in 6 months

## 2015-05-02 NOTE — Progress Notes (Signed)
   Subjective:   Bryan Simmons is a 50 y.o. male with a history of htn, prediabetes here for f/u of same  CHRONIC HYPERTENSION  Disease Monitoring  Blood pressure range: 130s/70s-80s  Chest pain: no   Dyspnea: no   Claudication: no   Medication compliance: yes  Medication Side Effects  Lightheadedness: no   Urinary frequency: no   Edema: no   Impotence: yes, predates meds   Preventitive Healthcare:  Exercise: yes, occasionally   Diet Pattern: well balanced   Salt Restriction: sometimes  Denies polyuria, polydipsia, wt changes  Review of Systems:  Per HPI. All other systems reviewed and are negative.   PMH, PSH, Medications, Allergies, and FmHx reviewed and updated in EMR.  Social History: never smoker  Objective:  BP 136/81 mmHg  Pulse 95  Temp(Src) 98.1 F (36.7 C) (Oral)  Ht 6' (1.829 m)  Wt 333 lb 3.2 oz (151.139 kg)  BMI 45.18 kg/m2  Gen:  50 y.o. male in NAD HEENT: NCAT, MMM, EOMI, PERRL, anicteric sclerae CV: RRR, no MRG, no JVD Resp: Non-labored, CTAB, no wheezes noted Abd: Soft, NTND, BS present, no guarding or organomegaly Ext: WWP, no edema MSK: Full ROM, strength intact Neuro: Alert and oriented, speech normal      Chemistry      Component Value Date/Time   NA 140 04/29/2015 1647   K 3.8 04/29/2015 1647   CL 100 04/29/2015 1647   CO2 23 04/29/2015 1647   BUN 17 04/29/2015 1647   CREATININE 0.89 04/29/2015 1647   CREATININE 0.81 10/29/2008 2137      Component Value Date/Time   CALCIUM 9.9 04/29/2015 1647   ALKPHOS 48 12/21/2011 1428   AST 13 12/21/2011 1428   ALT 19 12/21/2011 1428   BILITOT 0.4 12/21/2011 1428      No results found for: WBC, HGB, HCT, MCV, PLT Lab Results  Component Value Date   TSH 0.739 06/15/2011   Lab Results  Component Value Date   HGBA1C 5.9 04/29/2015   Assessment & Plan:     Bryan MendsJohn W Rohlman is a 50 y.o. male here for f/u htn, prediabetes  HYPERTENSION, BENIGN Well controlled - continue lisinopril  HCTZ - check BMP, lipids today - f/u in 6 months  Prediabetes Well controlled, A1c 5.9 today - continue metformin - encouraged low carb diet and increase physical activity - f/u in 1 year  Knee pain, bilateral Mild, relieved by current meds - continue voltaren prn      Beverely LowElena Adamo, MD, MPH [facility] PGY-3 05/02/2015 12:14 PM

## 2015-07-22 ENCOUNTER — Encounter: Payer: Self-pay | Admitting: Family Medicine

## 2015-07-22 ENCOUNTER — Ambulatory Visit (INDEPENDENT_AMBULATORY_CARE_PROVIDER_SITE_OTHER): Payer: BLUE CROSS/BLUE SHIELD | Admitting: Family Medicine

## 2015-07-22 VITALS — BP 159/82 | HR 94 | Temp 98.0°F | Ht 72.0 in | Wt 333.4 lb

## 2015-07-22 DIAGNOSIS — R0683 Snoring: Secondary | ICD-10-CM | POA: Diagnosis not present

## 2015-07-22 DIAGNOSIS — R5383 Other fatigue: Secondary | ICD-10-CM | POA: Insufficient documentation

## 2015-07-22 LAB — POCT HEMOGLOBIN: HEMOGLOBIN: 13.9 g/dL — AB (ref 14.1–18.1)

## 2015-07-22 NOTE — Progress Notes (Signed)
Date of Visit: 07/22/2015   HPI:  Patient presents to discuss fatigue with walking. For the last 6- 12 months has noticed that his body gets very tired when he walks. For example, walking from one end of [employer] to the other end causes him to get tired. No shortness of breath or chest pain. No swelling. No lightheadness or syncope. No blood in stool. Had colonoscoyp a few years ago after having diverticulitis. Generally feels sweaty (his whole life) but no increase recently. No fevers. Does not smoke. Denies history of heart or lung problems. He does snore. Has never had a sleep study.  ROS: See HPI.  PMFSH: history of allergic rhinitis, GERD, erectile dysfunction, asthma, hyperlipidemia, hypertension, prediabetes  PHYSICAL EXAM: BP 159/82 mmHg  Pulse 94  Temp(Src) 98 F (36.7 C) (Oral)  Ht 6' (1.829 m)  Wt 333 lb 6.4 oz (151.229 kg)  BMI 45.21 kg/m2 Gen: NAD, pleasant. cooperative HEENT: normocephalic, atraumatic. Moist mucous membranes. Large neck.  Heart: regular rate and rhythm. No murmur Lungs: clear to auscultation bilaterally, normal work of breathing  Neuro: alert, grossly nonfocal, speech normal Ext: No appreciable lower extremity edema bilaterally   ASSESSMENT/PLAN:  Fatigue Broad differential diagnosis. Doubt CHF or cardiac cause as he is not shortness of breath, nor does he have chest pain or swelling. Has normal exam today other than obesity. Given his BMI of 45 and history of snoring, along with prolonged duration of symptoms, would favor first evaluating for sleep apnea prior to proceeding with large scale workup for fatigue. Had recent labs that were unremarkable though did not have assessment for anemia. - split night study ordered - check POC hemoglobin today to ensure stable  - follow up with new PCP in a few weeks. - labs/studies to consider in the future: echo, CXR, CBC diff, testosterone, TSH, vit D   FOLLOW UP: Follow up in few weeks to meet new  PCP  GrenadaBrittany J. Pollie MeyerMcIntyre, MD [facility]

## 2015-07-22 NOTE — Patient Instructions (Signed)
Nice to meet you today  Getting you set up for a sleep study Checking hemoglobin today  Follow up with your new doctor the first week of July  Be well, Dr. Pollie MeyerMcIntyre

## 2015-07-22 NOTE — Assessment & Plan Note (Signed)
Broad differential diagnosis. Doubt CHF or cardiac cause as he is not shortness of breath, nor does he have chest pain or swelling. Has normal exam today other than obesity. Given his BMI of 45 and history of snoring, along with prolonged duration of symptoms, would favor first evaluating for sleep apnea prior to proceeding with large scale workup for fatigue. Had recent labs that were unremarkable though did not have assessment for anemia. - split night study ordered - check POC hemoglobin today to ensure stable  - follow up with new PCP in a few weeks. - labs/studies to consider in the future: echo, CXR, CBC diff, testosterone, TSH, vit D

## 2015-09-16 ENCOUNTER — Ambulatory Visit (HOSPITAL_BASED_OUTPATIENT_CLINIC_OR_DEPARTMENT_OTHER): Payer: BLUE CROSS/BLUE SHIELD | Attending: Family Medicine | Admitting: Internal Medicine

## 2015-09-16 VITALS — Ht 72.0 in | Wt 330.0 lb

## 2015-09-16 DIAGNOSIS — R0683 Snoring: Secondary | ICD-10-CM | POA: Diagnosis not present

## 2015-09-16 DIAGNOSIS — G4733 Obstructive sleep apnea (adult) (pediatric): Secondary | ICD-10-CM

## 2015-09-16 DIAGNOSIS — R5383 Other fatigue: Secondary | ICD-10-CM | POA: Insufficient documentation

## 2015-09-24 DIAGNOSIS — R0683 Snoring: Secondary | ICD-10-CM | POA: Diagnosis not present

## 2015-09-24 NOTE — Procedures (Signed)
Patient Name: Bryan Simmons, Cowie Date: 09/16/2015 Gender: Male D.O.B: Sep 12, 1965 Age (years): 56 Referring Provider: Leeanne Rio Height (inches): 72 Interpreting Physician: Baird Lyons MD, ABSM Weight (lbs): 330 RPSGT: Gerhard Perches BMI: 45 MRN: [id number] Neck Size: 19.00 CLINICAL INFORMATION Sleep Study Type: Split Night CPAP Indication for sleep study: Fatigue, Snoring Epworth Sleepiness Score: 12  SLEEP STUDY TECHNIQUE As per the AASM Manual for the Scoring of Sleep and Associated Events v2.3 (April 2016) with a hypopnea requiring 4% desaturations. The channels recorded and monitored were frontal, central and occipital EEG, electrooculogram (EOG), submentalis EMG (chin), nasal and oral airflow, thoracic and abdominal wall motion, anterior tibialis EMG, snore microphone, electrocardiogram, and pulse oximetry. Continuous positive airway pressure (CPAP) was initiated when the patient met split night criteria and was titrated according to treat sleep-disordered breathing.  MEDICATIONS Medications taken by the patient : charted for review Medications administered by patient during sleep study : No sleep medicine administered.  RESPIRATORY PARAMETERS Diagnostic Total AHI (/hr): 89.3 RDI (/hr): 92.7 OA Index (/hr): 18.6 CA Index (/hr): 0.0 REM AHI (/hr): 81.4 NREM AHI (/hr): 90.3 Supine AHI (/hr): 100.7 Non-supine AHI (/hr): 83.91 Min O2 Sat (%): 53.00 Mean O2 (%): 85.97 Time below 88% (min): 64.0   Titration Optimal Pressure (cm): 16 AHI at Optimal Pressure (/hr): 0.0 Min O2 at Optimal Pressure (%): 90.0 Supine % at Optimal (%): 0 Sleep % at Optimal (%): 92    SLEEP ARCHITECTURE The recording time for the entire night was 391.4 minutes. During a baseline period of 180.2 minutes, the patient slept for 125.6 minutes in REM and nonREM, yielding a sleep efficiency of 69.7%. Sleep onset after lights out was 37.6 minutes with a REM latency of 94.5 minutes. The patient  spent 9.16% of the night in stage N1 sleep, 79.70% in stage N2 sleep, 0.00% in stage N3 and 11.15% in REM. During the titration period of 204.5 minutes, the patient slept for 185.8 minutes in REM and nonREM, yielding a sleep efficiency of 90.8%. Sleep onset after CPAP initiation was 7.7 minutes with a REM latency of 23.0 minutes. The patient spent 4.84% of the night in stage N1 sleep, 58.83% in stage N2 sleep, 0.00% in stage N3 and 36.33% in REM.  CARDIAC DATA The 2 lead EKG demonstrated sinus rhythm. The mean heart rate was 79.80 beats per minute. Other EKG findings include: None.  LEG MOVEMENT DATA The total Periodic Limb Movements of Sleep (PLMS) were 251. The PLMS index was 48.07 .  IMPRESSIONS - Severe obstructive sleep apnea occurred during the diagnostic portion of the study (AHI = 89.3/hour). An optimal PAP pressure was selected for this patient ( 16 cm of water) - No significant central sleep apnea occurred during the diagnostic portion of the study (CAI = 0.0/hour). - Severe oxygen desaturation was noted during the diagnostic portion of the study (Min O2 = 53.00%). - Supplemental oxygen 1L added per protocol at 0051AM due to low sats before CPAP control. - The patient snored with Loud snoring volume during the diagnostic portion of the study. - No cardiac abnormalities were noted during this study. - Clinically significant periodic limb movements did not occur during sleep.   DIAGNOSIS - Obstructive Sleep Apnea (327.23 [G47.33 ICD-10])  RECOMMENDATIONS - Trial of CPAP therapy on 16 cm H2O with a Medium size Fisher&Paykel Full Face Mask Simplus mask and heated humidification. - Avoid alcohol, sedatives and other CNS depressants that may worsen sleep apnea and disrupt normal sleep architecture. -  Sleep hygiene should be reviewed to assess factors that may improve sleep quality. - Weight management and regular exercise should be initiated or continued.  [Electronically signed]  09/24/2015 10:29 AM  Baird Lyons MD, ABSM Diplomate, American Board of Sleep Medicine   NPI: [id number]  [place], American Board of Sleep Medicine  ELECTRONICALLY SIGNED ON:  09/24/2015, 10:26 AM [place] PH: [phone]   FX: [phone] [place]

## 2015-09-26 ENCOUNTER — Encounter: Payer: Self-pay | Admitting: Family Medicine

## 2015-09-26 DIAGNOSIS — E785 Hyperlipidemia, unspecified: Secondary | ICD-10-CM

## 2015-09-29 MED ORDER — PRAVASTATIN SODIUM 40 MG PO TABS: 40.0000 mg | ORAL_TABLET | Freq: Every day | ORAL | 3 refills | Status: DC

## 2015-10-25 ENCOUNTER — Encounter: Payer: Self-pay | Admitting: Family Medicine

## 2015-10-25 DIAGNOSIS — K219 Gastro-esophageal reflux disease without esophagitis: Secondary | ICD-10-CM

## 2015-10-26 MED ORDER — RANITIDINE HCL 150 MG PO TABS: 150.0000 mg | ORAL_TABLET | Freq: Two times a day (BID) | ORAL | 2 refills | Status: DC

## 2015-10-28 ENCOUNTER — Other Ambulatory Visit: Payer: Self-pay | Admitting: *Deleted

## 2015-10-28 DIAGNOSIS — I10 Essential (primary) hypertension: Secondary | ICD-10-CM

## 2015-10-28 MED ORDER — HYDROCHLOROTHIAZIDE 25 MG PO TABS: 25.0000 mg | ORAL_TABLET | Freq: Every day | ORAL | 11 refills | Status: DC

## 2015-11-11 ENCOUNTER — Ambulatory Visit (INDEPENDENT_AMBULATORY_CARE_PROVIDER_SITE_OTHER): Payer: BLUE CROSS/BLUE SHIELD | Admitting: Internal Medicine

## 2015-11-11 ENCOUNTER — Encounter: Payer: Self-pay | Admitting: Internal Medicine

## 2015-11-11 VITALS — BP 130/77 | HR 93 | Temp 98.1°F | Ht 72.0 in | Wt 331.4 lb

## 2015-11-11 DIAGNOSIS — E785 Hyperlipidemia, unspecified: Secondary | ICD-10-CM

## 2015-11-11 DIAGNOSIS — R7303 Prediabetes: Secondary | ICD-10-CM | POA: Diagnosis not present

## 2015-11-11 DIAGNOSIS — K219 Gastro-esophageal reflux disease without esophagitis: Secondary | ICD-10-CM

## 2015-11-11 DIAGNOSIS — E119 Type 2 diabetes mellitus without complications: Secondary | ICD-10-CM

## 2015-11-11 DIAGNOSIS — I10 Essential (primary) hypertension: Secondary | ICD-10-CM | POA: Diagnosis not present

## 2015-11-11 LAB — COMPLETE METABOLIC PANEL WITH GFR
ALT: 16 U/L (ref 9–46)
AST: 15 U/L (ref 10–35)
Albumin: 4.1 g/dL (ref 3.6–5.1)
Alkaline Phosphatase: 49 U/L (ref 40–115)
BILIRUBIN TOTAL: 0.5 mg/dL (ref 0.2–1.2)
BUN: 13 mg/dL (ref 7–25)
CHLORIDE: 98 mmol/L (ref 98–110)
CO2: 29 mmol/L (ref 20–31)
CREATININE: 0.97 mg/dL (ref 0.70–1.33)
Calcium: 9.2 mg/dL (ref 8.6–10.3)
GFR, Est Non African American: >89 mL/min (ref >=60)
Glucose, Bld: 93 mg/dL (ref 65–99)
Potassium: 3.8 mmol/L (ref 3.5–5.3)
Sodium: 138 mmol/L (ref 135–146)
TOTAL PROTEIN: 7.2 g/dL (ref 6.1–8.1)

## 2015-11-11 LAB — POCT GLYCOSYLATED HEMOGLOBIN (HGB A1C): HEMOGLOBIN A1C: 6

## 2015-11-11 LAB — VITAMIN B12: VITAMIN B 12: 492 pg/mL (ref 200–1100)

## 2015-11-11 MED ORDER — METFORMIN HCL 500 MG PO TABS: 500.0000 mg | ORAL_TABLET | Freq: Two times a day (BID) | ORAL | 11 refills | Status: DC

## 2015-11-11 NOTE — Progress Notes (Signed)
[facility] Progress Note  Subjective:  Bryan DingwallJohn Simmons is a 49-y/o male with history of HTN, GERD, prediabetes and HLD who presents for follow-up of chronic medical conditions.  HTN: - Taking HCTZ 25 mg daily; reports it makes him urinate - Had previously been on lisinopril but it made him feel dizzy when he went to lie down; denies any facial swelling or tongue swelling - Does not check BPs regularly or have a home cuff ROS: Denies HAs, vision changes, chest pain  HLD: - On pravastatin 40 mg daily ROS: No muscle cramping/aches  Prediates: - Does not regularly exercise because of odd work hours - Plans to join a 24-hour gym that opened up near his work, feels that he could reasonably workout 3 times a week - Drinks soda most days - Takes metformin 500 mg once daily and denies abdominal pain, diarrhea ROS: No extremity numbness or tingling  GERD: - On zantac 150 mg BID - Provides relief  Social: Never smoker  Objective: Blood pressure 130/77, pulse 93, temperature 98.1 F (36.7 C), temperature source Oral, height 6' (1.829 m), weight (!) 331 lb 6.4 oz (150.3 kg). Body mass index is 44.95 kg/m. Constitutional: Obese male, very pleasant male, in NAD Cardiovascular: RRR, S1, S2, no m/r/g.  Pulmonary/Chest: Effort normal and breath sounds normal. No respiratory distress.  Abdomen: +BS, soft, NT Neurological: AOx3, no focal deficits. Vitals reviewed  Assessment/Plan: HYPERTENSION, BENIGN - Initial BP elevated but at goal of < 140/90 and normal upon repeat - Continue HCTZ 25 mg daily - Check electrolytes with CMP - Encouraged patient to follow through with his goal of going to the gym 3 times a week  HLD (hyperlipidemia) - Continue pravastatin 40 mg - Obtain LFTs with CMP  Prediabetes - Hgb A1c stable at 6.0, was 5.9 six months ago - Increase metformin to 500 mg BID for improved control, as pt tolerates it well - Obtain B12 level, as patient has been on  metformin chronically, to r/o deficiency - Decrease soda intake, increase water intake  GASTROESOPHAGEAL REFLUX, NO ESOPHAGITIS - Continue zantac   Follow-up towards the end of the year to assess weight loss and ensure blood pressure controlled.  Bryan GobbleHillary Fitzgerald, MD [facility], PGY-2

## 2015-11-11 NOTE — Patient Instructions (Signed)
Mr. Bryan Simmons,  It was nice to meet you today!  Please take metformin twice daily with meals.  Your blood pressure goal is less than 140/90. If you can check some measurements between our next visit, that would be great. Please continue hydrochlorothiazide. Your goal of going to the gym three times daily is a great one. I recommend cutting back on soda.  Please make a follow-up visit in a couple months as is convenient for you. Please follow-up sooner if blood pressure remains elevated.  I will call with lab results.  Best, Dr. Sampson GoonFitzgerald

## 2015-11-15 NOTE — Assessment & Plan Note (Signed)
Continue zantac.  

## 2015-11-15 NOTE — Assessment & Plan Note (Addendum)
-   Hgb A1c stable at 6.0, was 5.9 six months ago - Increase metformin to 500 mg BID for improved control, as pt tolerates it well - Obtain B12 level, as patient has been on metformin chronically, to r/o deficiency - Decrease soda intake, increase water intake

## 2015-11-15 NOTE — Assessment & Plan Note (Addendum)
-   Initial BP elevated but at goal of < 140/90 and normal upon repeat - Continue HCTZ 25 mg daily - Check electrolytes with CMP - Encouraged patient to follow through with his goal of going to the gym 3 times a week

## 2015-11-15 NOTE — Assessment & Plan Note (Signed)
-   Continue pravastatin 40 mg - Obtain LFTs with CMP

## 2016-01-26 ENCOUNTER — Encounter: Payer: Self-pay | Admitting: Internal Medicine

## 2016-01-26 ENCOUNTER — Other Ambulatory Visit: Payer: Self-pay | Admitting: Family Medicine

## 2016-01-26 DIAGNOSIS — K219 Gastro-esophageal reflux disease without esophagitis: Secondary | ICD-10-CM

## 2016-03-30 DIAGNOSIS — M13861 Other specified arthritis, right knee: Secondary | ICD-10-CM | POA: Diagnosis not present

## 2016-05-01 ENCOUNTER — Other Ambulatory Visit: Payer: Self-pay | Admitting: *Deleted

## 2016-05-01 ENCOUNTER — Encounter: Payer: Self-pay | Admitting: Internal Medicine

## 2016-05-01 DIAGNOSIS — K219 Gastro-esophageal reflux disease without esophagitis: Secondary | ICD-10-CM

## 2016-05-01 DIAGNOSIS — R7303 Prediabetes: Secondary | ICD-10-CM

## 2016-05-01 MED ORDER — METFORMIN HCL 500 MG PO TABS: 500.0000 mg | ORAL_TABLET | Freq: Two times a day (BID) | ORAL | 11 refills | Status: DC

## 2016-05-01 MED ORDER — RANITIDINE HCL 150 MG PO TABS
150.0000 mg | ORAL_TABLET | Freq: Two times a day (BID) | ORAL | 1 refills | Status: DC
Start: 2016-05-01 — End: 2016-10-29

## 2016-05-01 MED ORDER — DICLOFENAC SODIUM 75 MG PO TBEC: 75.0000 mg | DELAYED_RELEASE_TABLET | Freq: Two times a day (BID) | ORAL | 5 refills | Status: DC | PRN

## 2016-05-10 ENCOUNTER — Encounter: Payer: Self-pay | Admitting: Internal Medicine

## 2016-05-11 ENCOUNTER — Encounter: Payer: Self-pay | Admitting: Internal Medicine

## 2016-05-14 ENCOUNTER — Other Ambulatory Visit: Payer: Self-pay | Admitting: Internal Medicine

## 2016-05-14 DIAGNOSIS — N529 Male erectile dysfunction, unspecified: Secondary | ICD-10-CM

## 2016-05-14 MED ORDER — SILDENAFIL CITRATE 100 MG PO TABS: 50.0000 mg | ORAL_TABLET | Freq: Every day | ORAL | 2 refills | Status: DC | PRN

## 2016-05-14 NOTE — Telephone Encounter (Signed)
Patient requested refill of viagra by email messaging. Sent to [facility] on [address].

## 2016-05-22 ENCOUNTER — Encounter: Payer: Self-pay | Admitting: Internal Medicine

## 2016-08-13 ENCOUNTER — Encounter: Payer: Self-pay | Admitting: Internal Medicine

## 2016-09-24 ENCOUNTER — Encounter: Payer: Self-pay | Admitting: Internal Medicine

## 2016-09-24 ENCOUNTER — Other Ambulatory Visit: Payer: Self-pay | Admitting: Family Medicine

## 2016-09-24 DIAGNOSIS — E785 Hyperlipidemia, unspecified: Secondary | ICD-10-CM

## 2016-09-25 ENCOUNTER — Other Ambulatory Visit: Payer: Self-pay | Admitting: Internal Medicine

## 2016-09-25 DIAGNOSIS — E785 Hyperlipidemia, unspecified: Secondary | ICD-10-CM

## 2016-09-25 MED ORDER — PRAVASTATIN SODIUM 40 MG PO TABS: 40.0000 mg | ORAL_TABLET | Freq: Every day | ORAL | 3 refills | Status: DC

## 2016-10-26 ENCOUNTER — Encounter: Payer: Self-pay | Admitting: Internal Medicine

## 2016-10-29 ENCOUNTER — Other Ambulatory Visit: Payer: Self-pay | Admitting: Internal Medicine

## 2016-10-29 DIAGNOSIS — I10 Essential (primary) hypertension: Secondary | ICD-10-CM

## 2016-10-29 DIAGNOSIS — K219 Gastro-esophageal reflux disease without esophagitis: Secondary | ICD-10-CM

## 2016-10-29 MED ORDER — HYDROCHLOROTHIAZIDE 25 MG PO TABS: 25.0000 mg | ORAL_TABLET | Freq: Every day | ORAL | 11 refills | Status: DC

## 2016-10-29 MED ORDER — RANITIDINE HCL 150 MG PO TABS: 150.0000 mg | ORAL_TABLET | Freq: Two times a day (BID) | ORAL | 1 refills | Status: DC

## 2016-12-27 ENCOUNTER — Encounter: Payer: Self-pay | Admitting: Internal Medicine

## 2017-01-02 ENCOUNTER — Other Ambulatory Visit: Payer: Self-pay | Admitting: Internal Medicine

## 2017-01-02 DIAGNOSIS — N529 Male erectile dysfunction, unspecified: Secondary | ICD-10-CM

## 2017-01-02 MED ORDER — SILDENAFIL CITRATE 100 MG PO TABS: 50.0000 mg | ORAL_TABLET | Freq: Every day | ORAL | 2 refills | Status: DC | PRN

## 2017-01-04 ENCOUNTER — Other Ambulatory Visit: Payer: Self-pay | Admitting: Internal Medicine

## 2017-01-04 DIAGNOSIS — N529 Male erectile dysfunction, unspecified: Secondary | ICD-10-CM

## 2017-01-04 MED ORDER — SILDENAFIL CITRATE 100 MG PO TABS
50.0000 mg | ORAL_TABLET | Freq: Every day | ORAL | 2 refills | Status: DC | PRN
Start: 2017-01-04 — End: 2017-07-29

## 2017-04-23 ENCOUNTER — Other Ambulatory Visit: Payer: Self-pay

## 2017-04-23 DIAGNOSIS — K219 Gastro-esophageal reflux disease without esophagitis: Secondary | ICD-10-CM

## 2017-04-23 MED ORDER — RANITIDINE HCL 150 MG PO TABS: 150.0000 mg | ORAL_TABLET | Freq: Two times a day (BID) | ORAL | 1 refills | Status: DC

## 2017-04-29 ENCOUNTER — Other Ambulatory Visit: Payer: Self-pay

## 2017-04-29 DIAGNOSIS — R7303 Prediabetes: Secondary | ICD-10-CM

## 2017-04-29 MED ORDER — METFORMIN HCL 500 MG PO TABS: 500.0000 mg | ORAL_TABLET | Freq: Two times a day (BID) | ORAL | 0 refills | Status: DC

## 2017-05-19 DIAGNOSIS — N5089 Other specified disorders of the male genital organs: Secondary | ICD-10-CM | POA: Diagnosis not present

## 2017-05-20 ENCOUNTER — Encounter: Payer: Self-pay | Admitting: Internal Medicine

## 2017-05-29 ENCOUNTER — Other Ambulatory Visit: Payer: Self-pay

## 2017-06-04 ENCOUNTER — Encounter: Payer: Self-pay | Admitting: Internal Medicine

## 2017-06-04 ENCOUNTER — Other Ambulatory Visit: Payer: Self-pay | Admitting: Internal Medicine

## 2017-06-04 DIAGNOSIS — R7303 Prediabetes: Secondary | ICD-10-CM

## 2017-06-04 MED ORDER — METFORMIN HCL 500 MG PO TABS: 500.0000 mg | ORAL_TABLET | Freq: Two times a day (BID) | ORAL | 0 refills | Status: DC

## 2017-06-11 ENCOUNTER — Encounter: Payer: Self-pay | Admitting: Internal Medicine

## 2017-06-12 ENCOUNTER — Encounter: Payer: Self-pay | Admitting: Internal Medicine

## 2017-07-03 ENCOUNTER — Other Ambulatory Visit: Payer: Self-pay | Admitting: Internal Medicine

## 2017-07-03 ENCOUNTER — Encounter: Payer: Self-pay | Admitting: Internal Medicine

## 2017-07-03 DIAGNOSIS — R7303 Prediabetes: Secondary | ICD-10-CM

## 2017-07-04 ENCOUNTER — Other Ambulatory Visit: Payer: Self-pay | Admitting: Internal Medicine

## 2017-07-04 MED ORDER — DICLOFENAC SODIUM 75 MG PO TBEC: 75.0000 mg | DELAYED_RELEASE_TABLET | Freq: Two times a day (BID) | ORAL | 0 refills | Status: DC | PRN

## 2017-07-04 MED ORDER — METFORMIN HCL 500 MG PO TABS: 500.0000 mg | ORAL_TABLET | Freq: Two times a day (BID) | ORAL | 0 refills | Status: DC

## 2017-07-04 NOTE — Telephone Encounter (Signed)
Called patient to let him know he is overdue for checkup with labs. He stated understanding and will make appointment. Ordered 1 month refills of metformin and #30 diclofenac.

## 2017-07-26 ENCOUNTER — Other Ambulatory Visit: Payer: Self-pay

## 2017-07-26 ENCOUNTER — Ambulatory Visit (INDEPENDENT_AMBULATORY_CARE_PROVIDER_SITE_OTHER): Payer: BLUE CROSS/BLUE SHIELD | Admitting: Internal Medicine

## 2017-07-26 VITALS — BP 120/78 | HR 90 | Temp 98.4°F | Ht 72.0 in | Wt 335.6 lb

## 2017-07-26 DIAGNOSIS — I10 Essential (primary) hypertension: Secondary | ICD-10-CM | POA: Diagnosis not present

## 2017-07-26 DIAGNOSIS — E785 Hyperlipidemia, unspecified: Secondary | ICD-10-CM

## 2017-07-26 DIAGNOSIS — N529 Male erectile dysfunction, unspecified: Secondary | ICD-10-CM | POA: Diagnosis not present

## 2017-07-26 DIAGNOSIS — Z5181 Encounter for therapeutic drug level monitoring: Secondary | ICD-10-CM | POA: Diagnosis not present

## 2017-07-26 DIAGNOSIS — R7303 Prediabetes: Secondary | ICD-10-CM

## 2017-07-26 DIAGNOSIS — K219 Gastro-esophageal reflux disease without esophagitis: Secondary | ICD-10-CM

## 2017-07-26 LAB — POCT GLYCOSYLATED HEMOGLOBIN (HGB A1C): HbA1c, POC (controlled diabetic range): 6 % (ref 0.0–7.0)

## 2017-07-26 NOTE — Patient Instructions (Signed)
Mr. Bryan Simmons,  I will send you a message with your lab results.  Losing weight will help with knee pain and eligibility for surgery.  Continue metformin and pravastatin and hydrochlorothiazide.  See us back in at least a year or sooner to discuss weight loss or blood sugar control.  Best, Dr. Sampson GoonFitzgerald

## 2017-07-27 LAB — LIPID PANEL
CHOL/HDL RATIO: 3 ratio (ref 0.0–5.0)
Cholesterol, Total: 162 mg/dL (ref 100–199)
HDL: 54 mg/dL (ref >39)
LDL CALC: 92 mg/dL (ref 0–99)
TRIGLYCERIDES: 78 mg/dL (ref 0–149)
VLDL CHOLESTEROL CAL: 16 mg/dL (ref 5–40)

## 2017-07-27 LAB — COMPREHENSIVE METABOLIC PANEL
ALT: 14 IU/L (ref 0–44)
AST: 13 IU/L (ref 0–40)
Albumin/Globulin Ratio: 1.3 (ref 1.2–2.2)
Albumin: 4.1 g/dL (ref 3.5–5.5)
Alkaline Phosphatase: 64 IU/L (ref 39–117)
BUN/Creatinine Ratio: 20 (ref 9–20)
BUN: 19 mg/dL (ref 6–24)
Bilirubin Total: 0.3 mg/dL (ref 0.0–1.2)
CALCIUM: 9.3 mg/dL (ref 8.7–10.2)
CO2: 25 mmol/L (ref 20–29)
CREATININE: 0.94 mg/dL (ref 0.76–1.27)
Chloride: 99 mmol/L (ref 96–106)
GFR calc Af Amer: 107 mL/min/{1.73_m2} (ref >59)
GFR, EST NON AFRICAN AMERICAN: 93 mL/min/{1.73_m2} (ref >59)
Globulin, Total: 3.1 g/dL (ref 1.5–4.5)
Glucose: 106 mg/dL — ABNORMAL HIGH (ref 65–99)
Potassium: 4.3 mmol/L (ref 3.5–5.2)
Sodium: 140 mmol/L (ref 134–144)
Total Protein: 7.2 g/dL (ref 6.0–8.5)

## 2017-07-29 ENCOUNTER — Other Ambulatory Visit: Payer: Self-pay | Admitting: Internal Medicine

## 2017-07-29 ENCOUNTER — Encounter: Payer: Self-pay | Admitting: Internal Medicine

## 2017-07-29 MED ORDER — RANITIDINE HCL 150 MG PO TABS: 150.0000 mg | ORAL_TABLET | Freq: Two times a day (BID) | ORAL | 1 refills | Status: DC

## 2017-07-29 MED ORDER — SILDENAFIL CITRATE 100 MG PO TABS: 50.0000 mg | ORAL_TABLET | Freq: Every day | ORAL | 2 refills | Status: DC | PRN

## 2017-07-29 MED ORDER — HYDROCHLOROTHIAZIDE 25 MG PO TABS: 25.0000 mg | ORAL_TABLET | Freq: Every day | ORAL | 3 refills | Status: DC

## 2017-07-29 MED ORDER — DICLOFENAC SODIUM 75 MG PO TBEC: 75.0000 mg | DELAYED_RELEASE_TABLET | Freq: Two times a day (BID) | ORAL | 0 refills | Status: DC | PRN

## 2017-07-29 MED ORDER — METFORMIN HCL 500 MG PO TABS: 500.0000 mg | ORAL_TABLET | Freq: Two times a day (BID) | ORAL | 3 refills | Status: DC

## 2017-07-29 MED ORDER — PRAVASTATIN SODIUM 40 MG PO TABS: 40.0000 mg | ORAL_TABLET | Freq: Every day | ORAL | 3 refills | Status: DC

## 2017-07-29 MED ORDER — ATORVASTATIN CALCIUM 40 MG PO TABS: 40.0000 mg | ORAL_TABLET | Freq: Every day | ORAL | 3 refills | Status: DC

## 2017-07-29 NOTE — Assessment & Plan Note (Signed)
-   Discussed how this could be a barrier to getting knee replacement surgery. - Patient plans to start going to the gym and using exercise bike and decrease processed foods.

## 2017-07-29 NOTE — Assessment & Plan Note (Signed)
-   Will check lipid panel and CMP.

## 2017-07-29 NOTE — Assessment & Plan Note (Signed)
-   A1c stable at 6.0 from over a year ago. - Continue metformin 500 mg BID. (Discussed increasing dose but patient prefers to focus on lifestyle modification first).

## 2017-07-29 NOTE — Progress Notes (Signed)
[facility] Progress Note  Subjective:  Sheliah MendsJohn W Stirn is a 52 y.o. male with history of HTN, HLD, obesity, knee pain, and prediabetes who presents for check-up.   #HTN: - Takes hctz 25 mg daily  #Prediabetes: - Taking metformin 500 mg BID - Denies abdominal pain or GI upset - Does not miss doses  #Obesity: - Not exercising regularly, finds it difficult with the long hours he works at his job. - Has enjoyed using recumbent bike in the past and considering starting back at the gym.  - Knee pain getting worse, especially the right knee - Takes pravastatin 40 mg daily  HM: Reports having had a colonoscopy at [facility] about 5 years ago for diverticulitis. Unsure of monitoring recommendations.  ROS: No chest pain, no shortness of breath, no leg swelling.   Allergies  Allergen Reactions  . Lisinopril Other (See Comments)    Patient felt dizzy when lying down while taking this medication    Social History   Tobacco Use  . Smoking status: Never Smoker   EtOH: Drinks alcohol 2-3 times per week, 1 or 2 drinks on occasion.  Feels safe in his relationships.  Objective: Blood pressure 120/78, pulse 90, temperature 98.4 F (36.9 C), temperature source Oral, height 6' (1.829 m), weight (!) 335 lb 9.6 oz (152.2 kg), SpO2 93 %. Body mass index is 45.52 kg/m. Constitutional: Pleasant, morbidly obese male in NAD HENT: MMM, normal posterior oropharynx Cardiovascular: RRR, S1, S2, no m/r/g.  Pulmonary/Chest: Effort normal and breath sounds normal.  Abdominal: Protuberant abdomen. Soft. +BS, NT Musculoskeletal: Pain and crepitus with flexion and extension or right knee without effusion.  Neurological: AOx3, no focal deficits. Skin: Skin is warm and dry. No rash noted.  Psychiatric: Normal mood and affect.  Vitals reviewed  PHQ2: 0  Assessment/Plan: Prediabetes - A1c stable at 6.0 from over a year ago. - Continue metformin 500 mg BID. (Discussed  increasing dose but patient prefers to focus on lifestyle modification first).   HYPERTENSION, BENIGN - At goal of <130/90. Continue hctz 25 mg daily. - Will check CMP.  HLD (hyperlipidemia) - Will check lipid panel and CMP.  Morbid obesity (HCC) - Discussed how this could be a barrier to getting knee replacement surgery. - Patient plans to start going to the gym and using exercise bike and decrease processed foods.   HM: Had patient complete ROI to obtain outside colonoscopy records.   Follow-up at least annually but recommended sooner for prediabetes and weight loss monitoring.   Dani GobbleHillary Ieshia Hatcher, MD [facility], PGY-3

## 2017-07-29 NOTE — Assessment & Plan Note (Signed)
-   At goal of <130/90. Continue hctz 25 mg daily. - Will check CMP.

## 2017-07-30 ENCOUNTER — Encounter: Payer: Self-pay | Admitting: Internal Medicine

## 2017-08-23 ENCOUNTER — Other Ambulatory Visit: Payer: Self-pay | Admitting: Internal Medicine

## 2017-08-30 ENCOUNTER — Other Ambulatory Visit: Payer: Self-pay | Admitting: Internal Medicine

## 2017-10-21 ENCOUNTER — Telehealth: Payer: Self-pay | Admitting: Family Medicine

## 2017-10-21 NOTE — Telephone Encounter (Signed)
Spoke with pt but was unable to make an appt due to scheduling conflicts with his work and travel schedule.

## 2017-11-28 ENCOUNTER — Encounter: Payer: Self-pay | Admitting: Family Medicine

## 2017-11-30 ENCOUNTER — Other Ambulatory Visit: Payer: Self-pay | Admitting: Family Medicine

## 2017-11-30 DIAGNOSIS — I10 Essential (primary) hypertension: Secondary | ICD-10-CM

## 2017-11-30 MED ORDER — HYDROCHLOROTHIAZIDE 25 MG PO TABS: 25.0000 mg | ORAL_TABLET | Freq: Every day | ORAL | 3 refills | Status: DC

## 2018-01-30 ENCOUNTER — Ambulatory Visit: Payer: BLUE CROSS/BLUE SHIELD | Admitting: Family Medicine

## 2018-01-30 ENCOUNTER — Encounter: Payer: Self-pay | Admitting: Family Medicine

## 2018-01-30 ENCOUNTER — Other Ambulatory Visit: Payer: Self-pay

## 2018-01-30 VITALS — BP 124/72 | HR 95 | Temp 99.2°F | Ht 72.0 in | Wt 333.2 lb

## 2018-01-30 DIAGNOSIS — G8929 Other chronic pain: Secondary | ICD-10-CM

## 2018-01-30 DIAGNOSIS — M25562 Pain in left knee: Secondary | ICD-10-CM | POA: Diagnosis not present

## 2018-01-30 DIAGNOSIS — Z1211 Encounter for screening for malignant neoplasm of colon: Secondary | ICD-10-CM

## 2018-01-30 DIAGNOSIS — M25561 Pain in right knee: Secondary | ICD-10-CM | POA: Diagnosis not present

## 2018-01-30 DIAGNOSIS — Z23 Encounter for immunization: Secondary | ICD-10-CM | POA: Diagnosis not present

## 2018-01-30 MED ORDER — DICLOFENAC SODIUM 75 MG PO TBEC: DELAYED_RELEASE_TABLET | ORAL | 0 refills | Status: DC

## 2018-01-30 NOTE — Patient Instructions (Signed)
It was nice meeting you today.  You were seen in clinic for follow-up of your chronic right knee pain.  As we discussed, this is most likely due to your osteoarthritis.  I have refilled your diclofenac and would recommend weight loss as this will greatly benefit you in the long-term.    You were also given a tetanus shot today and I have placed a referral to gastroenterology for you to have a screening colonoscopy.    You can expect a call within a week or so regarding scheduling this appointment.   Please call clinic if you have any questions.  Freddrick MarchYashika Ramez Arrona MD

## 2018-01-30 NOTE — Progress Notes (Signed)
   Subjective:   Patient ID: Bryan Simmons    DOB: [birth year], 52 y.o. male   MRN: [id number]  CC: routine physical, refill chronic pain medications   HPI: Bryan MendsJohn W Crutcher is a 52 y.o. male who presents to clinic today for the following issues.  Chronic right knee pain Right knee, present x several years. Diagnosed with osteoarthritis about 10 years ago.  He takes diclofenac tablets. Sometimes uses Biofreeze which helps.  Is planning on knee replacement when he is closer to retirement.  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  Ambulates without a cane or walker.  No weakness, numbness or tingling noted. No issues with left knee.   Health maintenance: -due for tetanus shot  -due for flu shot -due for colonoscopy  ROS: No fever, chills, nausea, vomiting.  No numbness or tingling.   Social: pt is a never smoker.  Medications reviewed. Objective:   BP 124/72   Pulse 95   Temp 99.2 F (37.3 C) (Oral)   Ht 6' (1.829 m)   Wt (!) 333 lb 3.2 oz (151.1 kg)   SpO2 93%   BMI 45.19 kg/m  Vitals and nursing note reviewed.  General: pleasant 52 yo male, NAD  HEENT: NCAT, EOMI, PERRL, MMM, oropharynx clear Neck: supple CV: RRR no MRG  Lungs: CTAB, normal effort  Abdomen: soft, NTND, +bs  Skin: warm, dry, no rash Extremities: warm and well perfused Neuro: alert, oriented x3, no focal deficits   Assessment & Plan:   Knee pain, bilateral Chronic right knee pain 2/2 osteoarthritis -Recommend weight loss -Counseled on diet and exercise -Rx: refill diclofenac  Health maintenance: -flu shot declined  -tetanus shot given today -discussed screening colonoscopy, referral to GI placed.  Anticipatory guidance reviewed with patient.  Orders Placed This Encounter  Procedures  . Tdap vaccine greater than or equal to 7yo IM  . Ambulatory referral to Gastroenterology    Referral Priority:   Routine    Referral Type:   Consultation    Referral  Reason:   Specialty Services Required    Number of Visits Requested:   1   Meds ordered this encounter  Medications  . diclofenac (VOLTAREN) 75 MG EC tablet    Sig: TAKE 1 TABLET BY MOUTH TWICE DAILY AS NEEDED FOR MODERATE PAIN    Dispense:  90 tablet    Refill:  0    Please consider 90 day supplies to promote better adherence   Freddrick MarchYashika Chad Tiznado, MD [facility]

## 2018-02-06 NOTE — Assessment & Plan Note (Signed)
Chronic right knee pain 2/2 osteoarthritis -Recommend weight loss -Counseled on diet and exercise -Rx: refill diclofenac

## 2018-03-03 ENCOUNTER — Other Ambulatory Visit: Payer: Self-pay | Admitting: *Deleted

## 2018-03-03 MED ORDER — DICLOFENAC SODIUM 75 MG PO TBEC: DELAYED_RELEASE_TABLET | ORAL | 0 refills | Status: DC

## 2018-03-06 DIAGNOSIS — M13861 Other specified arthritis, right knee: Secondary | ICD-10-CM | POA: Diagnosis not present

## 2018-04-02 ENCOUNTER — Encounter: Payer: Self-pay | Admitting: Family Medicine

## 2018-04-04 ENCOUNTER — Other Ambulatory Visit: Payer: Self-pay | Admitting: Family Medicine

## 2018-04-04 MED ORDER — DICLOFENAC SODIUM 75 MG PO TBEC: DELAYED_RELEASE_TABLET | ORAL | 0 refills | Status: DC

## 2018-05-04 ENCOUNTER — Other Ambulatory Visit: Payer: Self-pay | Admitting: Family Medicine

## 2018-05-15 ENCOUNTER — Encounter: Payer: Self-pay | Admitting: Family Medicine

## 2018-05-19 MED ORDER — FAMOTIDINE 20 MG PO TABS: 20.0000 mg | ORAL_TABLET | Freq: Two times a day (BID) | ORAL | 1 refills | Status: DC

## 2018-06-25 ENCOUNTER — Other Ambulatory Visit: Payer: Self-pay | Admitting: Family Medicine

## 2018-07-08 ENCOUNTER — Other Ambulatory Visit: Payer: Self-pay

## 2018-07-08 DIAGNOSIS — N529 Male erectile dysfunction, unspecified: Secondary | ICD-10-CM

## 2018-07-09 MED ORDER — SILDENAFIL CITRATE 100 MG PO TABS: 50.0000 mg | ORAL_TABLET | Freq: Every day | ORAL | 2 refills | Status: DC | PRN

## 2018-07-20 ENCOUNTER — Other Ambulatory Visit: Payer: Self-pay | Admitting: Family Medicine

## 2018-08-26 DIAGNOSIS — M13861 Other specified arthritis, right knee: Secondary | ICD-10-CM | POA: Diagnosis not present

## 2018-09-03 ENCOUNTER — Other Ambulatory Visit: Payer: Self-pay | Admitting: Internal Medicine

## 2018-09-18 ENCOUNTER — Encounter: Payer: Self-pay | Admitting: Student in an Organized Health Care Education/Training Program

## 2018-09-20 ENCOUNTER — Other Ambulatory Visit: Payer: Self-pay

## 2018-09-25 MED ORDER — FAMOTIDINE 20 MG PO TABS: 20.0000 mg | ORAL_TABLET | Freq: Two times a day (BID) | ORAL | 1 refills | Status: DC

## 2018-09-26 ENCOUNTER — Other Ambulatory Visit: Payer: Self-pay | Admitting: Student in an Organized Health Care Education/Training Program

## 2018-09-26 DIAGNOSIS — R7303 Prediabetes: Secondary | ICD-10-CM

## 2018-09-26 MED ORDER — METFORMIN HCL 500 MG PO TABS: 500.0000 mg | ORAL_TABLET | Freq: Two times a day (BID) | ORAL | 3 refills | Status: DC

## 2018-09-26 NOTE — Progress Notes (Signed)
Refilled metformin

## 2018-11-22 ENCOUNTER — Encounter: Payer: Self-pay | Admitting: Student in an Organized Health Care Education/Training Program

## 2018-11-24 ENCOUNTER — Encounter: Payer: Self-pay | Admitting: Student in an Organized Health Care Education/Training Program

## 2018-11-24 ENCOUNTER — Other Ambulatory Visit: Payer: Self-pay | Admitting: Student in an Organized Health Care Education/Training Program

## 2018-11-24 DIAGNOSIS — I10 Essential (primary) hypertension: Secondary | ICD-10-CM

## 2018-11-24 MED ORDER — HYDROCHLOROTHIAZIDE 25 MG PO TABS: 25.0000 mg | ORAL_TABLET | Freq: Every day | ORAL | 0 refills | Status: DC

## 2018-11-24 NOTE — Progress Notes (Signed)
Refilled patient's HCTZ and requesting he come in for BP monitoring and basic labs

## 2018-12-05 ENCOUNTER — Encounter: Payer: Self-pay | Admitting: Student in an Organized Health Care Education/Training Program

## 2018-12-05 ENCOUNTER — Other Ambulatory Visit: Payer: Self-pay

## 2018-12-05 ENCOUNTER — Ambulatory Visit (INDEPENDENT_AMBULATORY_CARE_PROVIDER_SITE_OTHER): Payer: Self-pay | Admitting: Student in an Organized Health Care Education/Training Program

## 2018-12-05 VITALS — BP 124/78 | HR 92 | Ht 72.0 in | Wt 336.0 lb

## 2018-12-05 DIAGNOSIS — M25561 Pain in right knee: Secondary | ICD-10-CM

## 2018-12-05 DIAGNOSIS — Z0000 Encounter for general adult medical examination without abnormal findings: Secondary | ICD-10-CM | POA: Insufficient documentation

## 2018-12-05 DIAGNOSIS — M25562 Pain in left knee: Secondary | ICD-10-CM

## 2018-12-05 DIAGNOSIS — R7303 Prediabetes: Secondary | ICD-10-CM

## 2018-12-05 DIAGNOSIS — G8929 Other chronic pain: Secondary | ICD-10-CM

## 2018-12-05 LAB — POCT GLYCOSYLATED HEMOGLOBIN (HGB A1C): HbA1c, POC (controlled diabetic range): 6.2 % (ref 0.0–7.0)

## 2018-12-05 MED ORDER — DICLOFENAC SODIUM 1 % TD GEL: 2.0000 g | Freq: Four times a day (QID) | TRANSDERMAL | 0 refills | Status: DC | PRN

## 2018-12-05 MED ORDER — FAMOTIDINE 20 MG PO TABS: 20.0000 mg | ORAL_TABLET | Freq: Two times a day (BID) | ORAL | 1 refills | Status: DC

## 2018-12-05 NOTE — Assessment & Plan Note (Addendum)
Completed health maintenance measures today. Patient declined flu and Pneumovax Up-to-date on eye exam until December with no new concerns Up-to-date on colonoscopy and patient will attempt to obtain the records of those results to ensure having proper follow-up.  Denies any complications Performed diabetic foot exam today A1c increased to 6.2 today from 6.0 last year Continue atorvastatin and consider checking lipid panel at next visit

## 2018-12-05 NOTE — Progress Notes (Signed)
Subjective:    Patient ID: Bryan Simmons, male    DOB: February 10, 1965, 53 y.o.   MRN: [id number]  CC: Chronic med check physical  HPI:  Patient has no specific concerns or complaints today.  We reviewed his health maintenance checklist. Vaccinations-patient declined Pneumovax and flu shot today. Diabetes-checking A1c, microalbumin, foot exam today.  Patient had just use the bathroom prior to appointment so is not able to give urine sample.  Will come back for future lab.  Patient had eye exam in December 2019 and denies any issues.  Denies any changes in his vision since that time and plans to get another check this December. Colonoscopy-patient states he got a colonoscopy in 2015 which was negative for polyps or lesions.  Records are not in our system for review and patient does not have the records personally.  I recommended patient try to obtain his records to verify when he needs a repeat colonoscopy.  As he believes he was told he needed follow-up in 5 years. Chronic knee pain-patient takes oral diclofenac with good relief.  He is open to trying topical Voltaren gel at this time. Hyperlipidemia-adherent with atorvastatin daily.  Smoking status reviewed   ROS: pertinent noted in the HPI   I have personally reviewed pertinent past medical history, surgical, family, and social history as appropriate.  Objective:  BP 124/78   Pulse 92   Ht 6' (1.829 m)   Wt (!) 336 lb (152.4 kg)   SpO2 96%   BMI 45.57 kg/m   Vitals and nursing note reviewed  General: NAD, pleasant, able to participate in exam Cardiac: RRR, S1 S2 present. normal heart sounds, no murmurs. Respiratory: CTAB, normal effort, No wheezes, rales or rhonchi Extremities: no edema or cyanosis. Skin: warm and dry, no rashes noted Neuro: alert, no obvious focal deficits Psych: Normal affect and mood  Diabetic Foot Exam - Simple   Simple Foot Form Diabetic Foot exam was performed with the following findings: Yes 12/05/2018  11:46 AM  Visual Inspection No deformities, no ulcerations, no other skin breakdown bilaterally: Yes Sensation Testing See comments: Yes Pulse Check Posterior Tibialis and Dorsalis pulse intact bilaterally: Yes Comments When performing monofilament test patient was endorsing ability to feel the filament before I was even touching his foot several times.  So I am not sure about the validity of his sensation test.  Nails appear to be very unkept.    Assessment & Plan:   Health maintenance examination Completed health maintenance measures today. Patient declined flu and Pneumovax Up-to-date on eye exam until December with no new concerns Up-to-date on colonoscopy and patient will attempt to obtain the records of those results to ensure having proper follow-up.  Denies any complications Performed diabetic foot exam today A1c increased to 6.2 today from 6.0 last year Continue atorvastatin and consider checking lipid panel at next visit  Knee pain, bilateral Chronic and unchanged Prescribed topical Voltaren gel  Orders Placed This Encounter  Procedures  . Microalbumin/Creatinine Ratio, Urine    Standing Status:   Future    Standing Expiration Date:   02/04/2019  . HgB A1c   Meds ordered this encounter  Medications  . famotidine (PEPCID) 20 MG tablet    Sig: Take 1 tablet (20 mg total) by mouth 2 (two) times daily.    Dispense:  90 tablet    Refill:  1  . diclofenac sodium (VOLTAREN) 1 % GEL    Sig: Apply 2 g topically 4 (four) times  daily as needed.    Dispense:  50 g    Refill:  0    Jamelle Rushing, DO [facility] PGY-2

## 2018-12-05 NOTE — Progress Notes (Signed)
check

## 2018-12-05 NOTE — Assessment & Plan Note (Signed)
Chronic and unchanged Prescribed topical Voltaren gel

## 2018-12-05 NOTE — Patient Instructions (Signed)
It was a pleasure to see you today!  To summarize our discussion for this visit:  Thank you for coming in for recheck today.  I do recommend that you get your pneumococcal vaccine and flu vaccine.  We completed your foot exam today.  Please follow-up with your eye exam this December and checking on your colonoscopy results.  I am prescribing a topical medication for your knee pain.  Some additional health maintenance measures we should update are: Health Maintenance Due  Topic Date Due  . PNEUMOCOCCAL POLYSACCHARIDE VACCINE AGE 8-64 HIGH RISK  01/01/1967  . FOOT EXAM  01/01/1975  . OPHTHALMOLOGY EXAM  01/01/1975  . URINE MICROALBUMIN  01/01/1975  . COLONOSCOPY  01/01/2015  . HEMOGLOBIN A1C  01/25/2018  .   Please return to our clinic to see me in about 6 months.  Call the clinic at [phone] if your symptoms worsen or you have any concerns.   Thank you for allowing me to take part in your care,  Dr. Doristine Mango

## 2018-12-08 ENCOUNTER — Encounter: Payer: Self-pay | Admitting: Student in an Organized Health Care Education/Training Program

## 2018-12-09 ENCOUNTER — Other Ambulatory Visit: Payer: Self-pay | Admitting: Student in an Organized Health Care Education/Training Program

## 2018-12-09 DIAGNOSIS — G8929 Other chronic pain: Secondary | ICD-10-CM

## 2018-12-09 DIAGNOSIS — M25562 Pain in left knee: Secondary | ICD-10-CM

## 2018-12-09 NOTE — Progress Notes (Signed)
Referring patient to sports med to initiate evaluation for knee replacement per patient request.

## 2018-12-10 ENCOUNTER — Encounter: Payer: Self-pay | Admitting: Student in an Organized Health Care Education/Training Program

## 2018-12-10 ENCOUNTER — Other Ambulatory Visit: Payer: Self-pay | Admitting: Student in an Organized Health Care Education/Training Program

## 2018-12-12 ENCOUNTER — Encounter: Payer: Self-pay | Admitting: Sports Medicine

## 2018-12-12 ENCOUNTER — Other Ambulatory Visit: Payer: Self-pay

## 2018-12-12 ENCOUNTER — Ambulatory Visit (INDEPENDENT_AMBULATORY_CARE_PROVIDER_SITE_OTHER): Payer: BC Managed Care – PPO | Admitting: Sports Medicine

## 2018-12-12 VITALS — BP 127/68 | Ht 72.0 in | Wt 330.0 lb

## 2018-12-12 DIAGNOSIS — M1711 Unilateral primary osteoarthritis, right knee: Secondary | ICD-10-CM

## 2018-12-12 MED ORDER — METHYLPREDNISOLONE ACETATE 40 MG/ML IJ SUSP
40.0000 mg | Freq: Once | INTRAMUSCULAR | Status: AC
  Administered 2018-12-12: 40 mg via INTRA_ARTICULAR

## 2018-12-12 NOTE — Addendum Note (Signed)
Addended by: Jolinda Croak E on: 12/12/2018 02:29 PM   Modules accepted: Orders

## 2018-12-12 NOTE — Patient Instructions (Addendum)
Your knee pain is caused by arthritis of your knee -You may receive steroid injections every 4 months into your knee to help with the pain.  As we discussed these may become less beneficial over time -You may continue to take over-the-counter anti-inflammatories for your knee pain -You may also try topical Voltaren gel as needed to help with the pain -I will refer you to orthopedic surgeon to discuss getting a knee replacement surgery -[facility]  At [address], phone: [phone] -We will call and get you an appt with one of the doctors. Once that is scheduled we will give you a call with the appt info

## 2018-12-12 NOTE — Addendum Note (Signed)
Addended by: Jolinda Croak E on: 12/12/2018 11:08 AM   Modules accepted: Orders

## 2018-12-12 NOTE — Progress Notes (Addendum)
PCP: Leeroy Bock, DO  Subjective:   HPI: Patient is a 53 y.o. male here for evaluation of right knee pain.  Patient notes his right knee pain is been present for the last several years.  He has been diagnosed with arthritis by orthopedic surgeon several hours away from here.  Patient notes he has been getting periodic injections from the surgeon with his last one being in July.  He notes the steroid injections have becoming less beneficial for him and of been wearing off quicker.  This last one lasted approximately 2 months.  Patient is interested in seeing a surgeon locally to discuss knee replacement.  He is currently taking Aleve and Tylenol for his knee pain.  He has not tried Voltaren gel.  He thinks he may have done viscosupplementation in the past but is not sure.  Patient notes the pain is medial on his right knee.  It does not radiate.  He notes kneeling, stairs, walking is painful for him.  He also notes stiffness after prolonged sitting.  Patient endorses some intermittent swelling but no bruising.  He denies any injury or trauma to his knee.   Review of Systems: See HPI above.  History reviewed. No pertinent past medical history.  Current Outpatient Medications on File Prior to Visit  Medication Sig Dispense Refill  . atorvastatin (LIPITOR) 40 MG tablet Take 1 tablet by mouth once daily 90 tablet 3  . diclofenac (VOLTAREN) 75 MG EC tablet TAKE 1 TABLET BY MOUTH TWICE DAILY AS NEEDED FOR MODERATE PAIN 90 tablet 0  . diclofenac (VOLTAREN) 75 MG EC tablet TAKE 1 TABLET BY MOUTH TWICE DAILY AS NEEDED FOR MODERATE PAIN 60 tablet 0  . diclofenac (VOLTAREN) 75 MG EC tablet TAKE 1 TABLET BY MOUTH TWICE DAILY AS NEEDED FOR MODERATE PAIN 90 tablet 0  . diclofenac sodium (VOLTAREN) 1 % GEL Apply 2 g topically 4 (four) times daily as needed. 50 g 0  . famotidine (PEPCID) 20 MG tablet Take 1 tablet (20 mg total) by mouth 2 (two) times daily. 90 tablet 1  . hydrochlorothiazide  (HYDRODIURIL) 25 MG tablet Take 1 tablet (25 mg total) by mouth daily. 90 tablet 0  . metFORMIN (GLUCOPHAGE) 500 MG tablet Take 1 tablet (500 mg total) by mouth 2 (two) times daily with a meal. 180 tablet 3  . sildenafil (VIAGRA) 100 MG tablet Take 0.5-1 tablets (50-100 mg total) by mouth daily as needed for erectile dysfunction. 15 tablet 2   No current facility-administered medications on file prior to visit.     History reviewed. No pertinent surgical history.  Allergies  Allergen Reactions  . Lisinopril Other (See Comments)    Patient felt dizzy when lying down while taking this medication    Social History   Socioeconomic History  . Marital status: Single    Spouse name: Not on file  . Number of children: Not on file  . Years of education: Not on file  . Highest education level: Not on file  Occupational History  . Not on file  Social Needs  . Financial resource strain: Not on file  . Food insecurity    Worry: Not on file    Inability: Not on file  . Transportation needs    Medical: Not on file    Non-medical: Not on file  Tobacco Use  . Smoking status: Never Smoker  . Smokeless tobacco: Never Used  Substance and Sexual Activity  . Alcohol use: Yes  . Drug  use: Not on file  . Sexual activity: Yes  Lifestyle  . Physical activity    Days per week: Not on file    Minutes per session: Not on file  . Stress: Not on file  Relationships  . Social Herbalist on phone: Not on file    Gets together: Not on file    Attends religious service: Not on file    Active member of club or organization: Not on file    Attends meetings of clubs or organizations: Not on file    Relationship status: Not on file  . Intimate partner violence    Fear of current or ex partner: Not on file    Emotionally abused: Not on file    Physically abused: Not on file    Forced sexual activity: Not on file  Other Topics Concern  . Not on file  Social History Narrative  . Not on  file    History reviewed. No pertinent family history.      Objective:  Physical Exam: BP 127/68   Ht 6' (1.829 m)   Wt (!) 330 lb (149.7 kg)   BMI 44.76 kg/m  Gen: NAD, comfortable in exam room Lungs: Breathing comfortably on room air Knee Exam Right -Inspection: no deformity, no discoloration -Palpation: Tenderness along the medial joint line. -ROM: Extension: 0 degrees; Flexion: 130 degrees.  Crepitus felt with testing range of motion -Strength: Extension: 5/5; Flexion: 5/5 -Special Tests: Varus Stress: Negative; Valgus Stress: Negative; Lachman: Negative; Posterior drawer: Negative; McMurray: Positive -Limb neurovascularly intact, no instability noted  Contralateral Knee -Inspection: no deformity, no discoloration -Palpation: medial joint line: Non-tender; lateral joint line: non-tender; quad tendon: non-tender; patella: non-tender; patellar tendon: non-tendon -ROM: Extension: 0 degrees; Flexion: 140 degrees -Strength: Extension: 5/5; Flexion: 5/5 -Limb neurovascularly intact, no instability noted    Assessment & Plan:  Patient is a 53 y.o. male here for evaluation of right knee pain  1.  Right knee osteoarthritis -I discussed the progressive nature of arthritis and the various treatment options available to him including oral and topical NSAIDs, corticosteroid injections, viscosupplementation, knee strengthening, joint replacement -I advised patient that prior to joint replacement he would need to get his BMI below 40.  Patient is in understanding of this.  He declined referral to a dietitian to discuss this further though. -Consent was obtained for corticosteroid injection of his right knee.  Timeout was performed prior to the procedure.  His right knee was cleaned and prepped using alcohol swabs.  40 mg of Depo-Medrol 3 cc of lidocaine were injected using sterile technique into the right knee.  Patient tolerated the injection well there no complications -Per patient  request I will refer him to [facility] to establish care and start the process for a knee replacement.  I did advise him that they would like to see his BMI below 40 before they would be willing to do a knee replacement for him -Patient to have further discussions with PCP regarding weight loss and possible referral to dietitian to help with this  Patient will follow-up at the sports medicine center on an as-needed basis  I was the preceptor for this visit and available for immediate consultation Shellia Cleverly, DO

## 2018-12-19 DIAGNOSIS — M25561 Pain in right knee: Secondary | ICD-10-CM | POA: Diagnosis not present

## 2018-12-25 DIAGNOSIS — I10 Essential (primary) hypertension: Secondary | ICD-10-CM | POA: Diagnosis not present

## 2018-12-25 DIAGNOSIS — E785 Hyperlipidemia, unspecified: Secondary | ICD-10-CM | POA: Insufficient documentation

## 2018-12-25 DIAGNOSIS — K219 Gastro-esophageal reflux disease without esophagitis: Secondary | ICD-10-CM | POA: Diagnosis not present

## 2018-12-25 DIAGNOSIS — R7303 Prediabetes: Secondary | ICD-10-CM | POA: Diagnosis not present

## 2019-01-06 DIAGNOSIS — R7303 Prediabetes: Secondary | ICD-10-CM | POA: Diagnosis not present

## 2019-01-06 DIAGNOSIS — I10 Essential (primary) hypertension: Secondary | ICD-10-CM | POA: Diagnosis not present

## 2019-01-06 DIAGNOSIS — E785 Hyperlipidemia, unspecified: Secondary | ICD-10-CM | POA: Diagnosis not present

## 2019-01-06 DIAGNOSIS — E669 Obesity, unspecified: Secondary | ICD-10-CM | POA: Diagnosis not present

## 2019-01-20 DIAGNOSIS — Z6841 Body Mass Index (BMI) 40.0 and over, adult: Secondary | ICD-10-CM | POA: Diagnosis not present

## 2019-01-20 DIAGNOSIS — I10 Essential (primary) hypertension: Secondary | ICD-10-CM | POA: Diagnosis not present

## 2019-01-20 DIAGNOSIS — R7303 Prediabetes: Secondary | ICD-10-CM | POA: Diagnosis not present

## 2019-01-20 DIAGNOSIS — E669 Obesity, unspecified: Secondary | ICD-10-CM | POA: Diagnosis not present

## 2019-02-03 DIAGNOSIS — Z6841 Body Mass Index (BMI) 40.0 and over, adult: Secondary | ICD-10-CM | POA: Diagnosis not present

## 2019-02-03 DIAGNOSIS — E669 Obesity, unspecified: Secondary | ICD-10-CM | POA: Diagnosis not present

## 2019-02-03 DIAGNOSIS — R7303 Prediabetes: Secondary | ICD-10-CM | POA: Diagnosis not present

## 2019-02-03 DIAGNOSIS — I10 Essential (primary) hypertension: Secondary | ICD-10-CM | POA: Diagnosis not present

## 2019-02-05 DIAGNOSIS — I10 Essential (primary) hypertension: Secondary | ICD-10-CM | POA: Diagnosis not present

## 2019-02-05 DIAGNOSIS — E785 Hyperlipidemia, unspecified: Secondary | ICD-10-CM | POA: Diagnosis not present

## 2019-02-05 DIAGNOSIS — Z6841 Body Mass Index (BMI) 40.0 and over, adult: Secondary | ICD-10-CM | POA: Diagnosis not present

## 2019-02-05 DIAGNOSIS — R7303 Prediabetes: Secondary | ICD-10-CM | POA: Diagnosis not present

## 2019-02-17 ENCOUNTER — Encounter: Payer: Self-pay | Admitting: Student in an Organized Health Care Education/Training Program

## 2019-02-17 ENCOUNTER — Other Ambulatory Visit: Payer: Self-pay | Admitting: Student in an Organized Health Care Education/Training Program

## 2019-02-17 DIAGNOSIS — I10 Essential (primary) hypertension: Secondary | ICD-10-CM

## 2019-02-26 DIAGNOSIS — I10 Essential (primary) hypertension: Secondary | ICD-10-CM | POA: Diagnosis not present

## 2019-02-26 DIAGNOSIS — R7303 Prediabetes: Secondary | ICD-10-CM | POA: Diagnosis not present

## 2019-02-26 DIAGNOSIS — E785 Hyperlipidemia, unspecified: Secondary | ICD-10-CM | POA: Diagnosis not present

## 2019-02-26 DIAGNOSIS — E669 Obesity, unspecified: Secondary | ICD-10-CM | POA: Diagnosis not present

## 2019-03-11 DIAGNOSIS — Z6841 Body Mass Index (BMI) 40.0 and over, adult: Secondary | ICD-10-CM | POA: Diagnosis not present

## 2019-03-11 DIAGNOSIS — Z713 Dietary counseling and surveillance: Secondary | ICD-10-CM | POA: Diagnosis not present

## 2019-04-14 DIAGNOSIS — Z6841 Body Mass Index (BMI) 40.0 and over, adult: Secondary | ICD-10-CM | POA: Diagnosis not present

## 2019-04-14 DIAGNOSIS — E785 Hyperlipidemia, unspecified: Secondary | ICD-10-CM | POA: Diagnosis not present

## 2019-04-14 DIAGNOSIS — I10 Essential (primary) hypertension: Secondary | ICD-10-CM | POA: Diagnosis not present

## 2019-04-27 DIAGNOSIS — I10 Essential (primary) hypertension: Secondary | ICD-10-CM | POA: Diagnosis not present

## 2019-04-27 DIAGNOSIS — E785 Hyperlipidemia, unspecified: Secondary | ICD-10-CM | POA: Diagnosis not present

## 2019-04-27 DIAGNOSIS — Z6841 Body Mass Index (BMI) 40.0 and over, adult: Secondary | ICD-10-CM | POA: Diagnosis not present

## 2019-04-27 DIAGNOSIS — R7303 Prediabetes: Secondary | ICD-10-CM | POA: Diagnosis not present

## 2019-04-28 DIAGNOSIS — Z713 Dietary counseling and surveillance: Secondary | ICD-10-CM | POA: Diagnosis not present

## 2019-04-28 DIAGNOSIS — Z6841 Body Mass Index (BMI) 40.0 and over, adult: Secondary | ICD-10-CM | POA: Diagnosis not present

## 2019-05-19 DIAGNOSIS — R7303 Prediabetes: Secondary | ICD-10-CM | POA: Diagnosis not present

## 2019-05-19 DIAGNOSIS — E669 Obesity, unspecified: Secondary | ICD-10-CM | POA: Diagnosis not present

## 2019-05-19 DIAGNOSIS — I10 Essential (primary) hypertension: Secondary | ICD-10-CM | POA: Diagnosis not present

## 2019-05-19 DIAGNOSIS — E785 Hyperlipidemia, unspecified: Secondary | ICD-10-CM | POA: Diagnosis not present

## 2019-05-21 ENCOUNTER — Other Ambulatory Visit: Payer: Self-pay | Admitting: Student in an Organized Health Care Education/Training Program

## 2019-05-21 DIAGNOSIS — I10 Essential (primary) hypertension: Secondary | ICD-10-CM

## 2019-06-02 ENCOUNTER — Other Ambulatory Visit: Payer: Self-pay | Admitting: *Deleted

## 2019-06-04 MED ORDER — FAMOTIDINE 20 MG PO TABS: 20.0000 mg | ORAL_TABLET | Freq: Two times a day (BID) | ORAL | 1 refills | Status: DC

## 2019-07-02 DIAGNOSIS — E785 Hyperlipidemia, unspecified: Secondary | ICD-10-CM | POA: Diagnosis not present

## 2019-07-02 DIAGNOSIS — I10 Essential (primary) hypertension: Secondary | ICD-10-CM | POA: Diagnosis not present

## 2019-07-02 DIAGNOSIS — E669 Obesity, unspecified: Secondary | ICD-10-CM | POA: Diagnosis not present

## 2019-07-02 DIAGNOSIS — R7303 Prediabetes: Secondary | ICD-10-CM | POA: Diagnosis not present

## 2019-07-03 ENCOUNTER — Other Ambulatory Visit: Payer: Self-pay | Admitting: *Deleted

## 2019-07-03 DIAGNOSIS — N529 Male erectile dysfunction, unspecified: Secondary | ICD-10-CM

## 2019-07-04 MED ORDER — SILDENAFIL CITRATE 100 MG PO TABS: 50.0000 mg | ORAL_TABLET | Freq: Every day | ORAL | 2 refills | Status: DC | PRN

## 2019-07-30 DIAGNOSIS — I10 Essential (primary) hypertension: Secondary | ICD-10-CM | POA: Diagnosis not present

## 2019-07-30 DIAGNOSIS — R7303 Prediabetes: Secondary | ICD-10-CM | POA: Diagnosis not present

## 2019-07-30 DIAGNOSIS — E785 Hyperlipidemia, unspecified: Secondary | ICD-10-CM | POA: Diagnosis not present

## 2019-07-30 DIAGNOSIS — R635 Abnormal weight gain: Secondary | ICD-10-CM | POA: Diagnosis not present

## 2019-08-26 ENCOUNTER — Encounter: Payer: Self-pay | Admitting: Student in an Organized Health Care Education/Training Program

## 2019-08-26 ENCOUNTER — Other Ambulatory Visit: Payer: Self-pay

## 2019-08-26 DIAGNOSIS — I10 Essential (primary) hypertension: Secondary | ICD-10-CM

## 2019-08-26 MED ORDER — HYDROCHLOROTHIAZIDE 25 MG PO TABS: 25.0000 mg | ORAL_TABLET | Freq: Every day | ORAL | 0 refills | Status: DC

## 2019-09-10 DIAGNOSIS — R7303 Prediabetes: Secondary | ICD-10-CM | POA: Diagnosis not present

## 2019-09-10 DIAGNOSIS — R635 Abnormal weight gain: Secondary | ICD-10-CM | POA: Diagnosis not present

## 2019-09-10 DIAGNOSIS — I10 Essential (primary) hypertension: Secondary | ICD-10-CM | POA: Diagnosis not present

## 2019-09-10 DIAGNOSIS — E785 Hyperlipidemia, unspecified: Secondary | ICD-10-CM | POA: Diagnosis not present

## 2019-09-22 ENCOUNTER — Other Ambulatory Visit: Payer: Self-pay | Admitting: Student in an Organized Health Care Education/Training Program

## 2019-10-05 DIAGNOSIS — Z01812 Encounter for preprocedural laboratory examination: Secondary | ICD-10-CM | POA: Diagnosis not present

## 2019-10-05 DIAGNOSIS — M25561 Pain in right knee: Secondary | ICD-10-CM | POA: Diagnosis not present

## 2019-10-15 DIAGNOSIS — I10 Essential (primary) hypertension: Secondary | ICD-10-CM | POA: Diagnosis not present

## 2019-10-15 DIAGNOSIS — R635 Abnormal weight gain: Secondary | ICD-10-CM | POA: Diagnosis not present

## 2019-10-15 DIAGNOSIS — R7303 Prediabetes: Secondary | ICD-10-CM | POA: Diagnosis not present

## 2019-10-15 DIAGNOSIS — Z6835 Body mass index (BMI) 35.0-35.9, adult: Secondary | ICD-10-CM | POA: Diagnosis not present

## 2019-10-20 ENCOUNTER — Other Ambulatory Visit: Payer: Self-pay | Admitting: Student in an Organized Health Care Education/Training Program

## 2019-10-21 DIAGNOSIS — M25561 Pain in right knee: Secondary | ICD-10-CM | POA: Diagnosis not present

## 2019-11-05 DIAGNOSIS — M1711 Unilateral primary osteoarthritis, right knee: Secondary | ICD-10-CM | POA: Diagnosis not present

## 2019-12-04 ENCOUNTER — Other Ambulatory Visit: Payer: Self-pay | Admitting: Family Medicine

## 2019-12-04 DIAGNOSIS — I10 Essential (primary) hypertension: Secondary | ICD-10-CM

## 2019-12-07 MED ORDER — HYDROCHLOROTHIAZIDE 25 MG PO TABS: 25.0000 mg | ORAL_TABLET | Freq: Every day | ORAL | 0 refills | Status: DC

## 2019-12-16 ENCOUNTER — Other Ambulatory Visit: Payer: Self-pay | Admitting: Student in an Organized Health Care Education/Training Program

## 2020-02-02 DIAGNOSIS — R7303 Prediabetes: Secondary | ICD-10-CM | POA: Diagnosis not present

## 2020-02-02 DIAGNOSIS — Z6841 Body Mass Index (BMI) 40.0 and over, adult: Secondary | ICD-10-CM | POA: Diagnosis not present

## 2020-02-02 DIAGNOSIS — E785 Hyperlipidemia, unspecified: Secondary | ICD-10-CM | POA: Diagnosis not present

## 2020-02-02 DIAGNOSIS — I10 Essential (primary) hypertension: Secondary | ICD-10-CM | POA: Diagnosis not present

## 2020-02-15 ENCOUNTER — Other Ambulatory Visit: Payer: Self-pay | Admitting: Student in an Organized Health Care Education/Training Program

## 2020-03-09 ENCOUNTER — Other Ambulatory Visit: Payer: Self-pay | Admitting: Student in an Organized Health Care Education/Training Program

## 2020-03-09 DIAGNOSIS — I10 Essential (primary) hypertension: Secondary | ICD-10-CM

## 2020-03-10 ENCOUNTER — Encounter: Payer: Self-pay | Admitting: Student in an Organized Health Care Education/Training Program

## 2020-03-18 ENCOUNTER — Ambulatory Visit
Admission: RE | Admit: 2020-03-18 | Discharge: 2020-03-18 | Disposition: A | Discharge status: Home / Self Care | Payer: BC Managed Care – PPO | Source: Ambulatory Visit | Attending: Family Medicine | Admitting: Family Medicine

## 2020-03-18 ENCOUNTER — Encounter: Payer: Self-pay | Admitting: Family Medicine

## 2020-03-18 ENCOUNTER — Other Ambulatory Visit: Payer: Self-pay

## 2020-03-18 ENCOUNTER — Ambulatory Visit (INDEPENDENT_AMBULATORY_CARE_PROVIDER_SITE_OTHER): Payer: BC Managed Care – PPO | Admitting: Family Medicine

## 2020-03-18 VITALS — BP 124/74 | Wt 275.0 lb

## 2020-03-18 DIAGNOSIS — Z471 Aftercare following joint replacement surgery: Secondary | ICD-10-CM | POA: Diagnosis not present

## 2020-03-18 DIAGNOSIS — M25561 Pain in right knee: Secondary | ICD-10-CM | POA: Diagnosis not present

## 2020-03-18 DIAGNOSIS — Z96651 Presence of right artificial knee joint: Secondary | ICD-10-CM | POA: Diagnosis not present

## 2020-03-18 DIAGNOSIS — G8929 Other chronic pain: Secondary | ICD-10-CM

## 2020-03-18 DIAGNOSIS — M25562 Pain in left knee: Secondary | ICD-10-CM

## 2020-03-18 DIAGNOSIS — M1712 Unilateral primary osteoarthritis, left knee: Secondary | ICD-10-CM

## 2020-03-18 MED ORDER — METHYLPREDNISOLONE ACETATE 40 MG/ML IJ SUSP
40.0000 mg | Freq: Once | INTRAMUSCULAR | Status: AC
  Administered 2020-03-18: 40 mg via INTRA_ARTICULAR

## 2020-03-18 NOTE — Assessment & Plan Note (Signed)
Corticosteroid injection today.  There is no imaging available in the chart.  He had some x-rays done several years ago of the left knee.  I think it would be prudent to get at least a standing AP view of the left knee to see where we are with joint space.  I let them know the results of that.  We discussed how often he can get corticosteroid injections and what to do if he has problems and neck steps if this does not give him some relief.

## 2020-03-18 NOTE — Progress Notes (Signed)
  Bryan Simmons - 55 y.o. male MRN [id number]  Date of birth: 1966-01-16    SUBJECTIVE:      Chief Complaint:/ HPI:  Left knee pain.  Patient status post right TKR in September 2021.  He is doing well from that perspective.  Prior to his knee replacement, he had medial knee pain on the right side.  The pain he is having on the left knee now is more frontal.  Significant pain with ascending stairs.  Some stiffness.  Aching in nature.  Nothing seems to make it better.  Previously on the right side he had some early treatment benefit from corticosteroid injections.  At times they lasted for almost a year.  He would like to try that in his left knee.  Dr. Eulah Pont did his right TKR.    OBJECTIVE: BP 124/74   Wt 275 lb (124.7 kg)   BMI 37.30 kg/m   Physical Exam:  Vital signs are reviewed. GENERAL: Well-developed male no acute distress Knee: Left.  Slight genu varus.  He lacks about 5 degrees of full extension and he has significant crepitus.  Positive patellar grind.  Mild lateral joint line tenderness. Skin: Skin around the area of the knee is without any sign of erythema or lesion.  No unusual warmth. PROCEDURE: INJECTION: Patient was given informed consent, signed copy in the chart. Appropriate time out was taken. Area prepped and draped in usual sterile fashion. Ethyl chloride was  used for local anesthesia. A 21 gauge 1 1/2 inch needle was used.. 1 cc of methylprednisolone 40 mg/ml plus 4 cc of 1% lidocaine without epinephrine was injected into the left knee using a(n) anterior medial approach.   The patient tolerated the procedure well. There were no complications. Post procedure instructions were given.   ASSESSMENT & PLAN:  See problem based charting & AVS for pt instructions. No problem-specific Assessment & Plan notes found for this encounter.

## 2020-03-22 ENCOUNTER — Other Ambulatory Visit: Payer: Self-pay | Admitting: Student in an Organized Health Care Education/Training Program

## 2020-03-22 DIAGNOSIS — N529 Male erectile dysfunction, unspecified: Secondary | ICD-10-CM

## 2020-03-25 ENCOUNTER — Encounter: Payer: Self-pay | Admitting: Family Medicine

## 2020-03-25 NOTE — Progress Notes (Signed)
Letter sent Moderate amount arthritis

## 2020-03-31 DIAGNOSIS — R7303 Prediabetes: Secondary | ICD-10-CM | POA: Diagnosis not present

## 2020-03-31 DIAGNOSIS — I10 Essential (primary) hypertension: Secondary | ICD-10-CM | POA: Diagnosis not present

## 2020-06-04 ENCOUNTER — Other Ambulatory Visit: Payer: Self-pay | Admitting: Student in an Organized Health Care Education/Training Program

## 2020-06-04 DIAGNOSIS — I10 Essential (primary) hypertension: Secondary | ICD-10-CM

## 2020-06-28 DIAGNOSIS — E669 Obesity, unspecified: Secondary | ICD-10-CM | POA: Diagnosis not present

## 2020-06-28 DIAGNOSIS — I10 Essential (primary) hypertension: Secondary | ICD-10-CM | POA: Diagnosis not present

## 2020-06-28 DIAGNOSIS — R7303 Prediabetes: Secondary | ICD-10-CM | POA: Diagnosis not present

## 2020-09-02 ENCOUNTER — Other Ambulatory Visit: Payer: Self-pay | Admitting: Student in an Organized Health Care Education/Training Program

## 2020-09-02 DIAGNOSIS — I10 Essential (primary) hypertension: Secondary | ICD-10-CM

## 2020-09-09 ENCOUNTER — Other Ambulatory Visit: Payer: Self-pay

## 2020-09-09 ENCOUNTER — Encounter: Payer: Self-pay | Admitting: Family Medicine

## 2020-09-09 ENCOUNTER — Ambulatory Visit (INDEPENDENT_AMBULATORY_CARE_PROVIDER_SITE_OTHER): Payer: BC Managed Care – PPO | Admitting: Family Medicine

## 2020-09-09 DIAGNOSIS — M1712 Unilateral primary osteoarthritis, left knee: Secondary | ICD-10-CM

## 2020-09-09 MED ORDER — METHYLPREDNISOLONE ACETATE 40 MG/ML IJ SUSP
40.0000 mg | Freq: Once | INTRAMUSCULAR | Status: AC
  Administered 2020-09-09: 40 mg via INTRA_ARTICULAR

## 2020-09-09 NOTE — Progress Notes (Signed)
  Bryan Simmons - 55 y.o. male MRN [id number]  Date of birth: Apr 20, 1965    SUBJECTIVE:      Chief Complaint:/ HPI:  Left knee pain Pain started coming back about 3 or 4 weeks ago.  He had injection done last in February of this year.  So he has had about 6 months of pain relief.  He is status post right TKR.  He knows at some point he will likely have to have TKR on the left side but wants to put it off if he can.   OBJECTIVE: BP 123/73   Ht 5' 9.5" (1.765 m)   Wt 276 lb (125.2 kg)   BMI 40.17 kg/m   Physical Exam:  Vital signs are reviewed. GENERAL: Well-developed male no acute distress BMI 40. KNEE: Left.  Full range of motion flexion extension.  Medial joint line tenderness.  No effusion.  There is no erythema or unusual skin changes.  Calf is soft.  Popliteal space is benign.  Distally he is neurovascularly intact. PROCEDURE: INJECTION: Patient was given informed consent, signed copy in the chart. Appropriate time out was taken. Area prepped and draped in usual sterile fashion. Ethyl chloride was  used for local anesthesia. A 21 gauge 1 1/2 inch needle was used..  1 cc of methylprednisolone 40 mg/ml plus 4 cc of 1% lidocaine without epinephrine was injected into the left knee using a(n) anterior medial approach.   The patient tolerated the procedure well. There were no complications. Post procedure instructions were given.   ASSESSMENT & PLAN:  See problem based charting & AVS for pt instructions. Primary osteoarthritis of left knee Corticosteroid injection today.  We discussed that he had significant length of time as in 6 months with last injection.  I hope this will last as long if not longer.  At some point he may gradually or suddenly have significantly less relief with CSI and he is aware of this.  I did review his previous x-rays which show TKR on the right knee and left knee medial joint space is bone-on-bone.  He will follow-up as needed.

## 2020-09-09 NOTE — Assessment & Plan Note (Signed)
Corticosteroid injection today.  We discussed that he had significant length of time as in 6 months with last injection.  I hope this will last as long if not longer.  At some point he may gradually or suddenly have significantly less relief with CSI and he is aware of this.  I did review his previous x-rays which show TKR on the right knee and left knee medial joint space is bone-on-bone.  He will follow-up as needed.

## 2020-09-27 DIAGNOSIS — Z6837 Body mass index (BMI) 37.0-37.9, adult: Secondary | ICD-10-CM | POA: Diagnosis not present

## 2020-09-27 DIAGNOSIS — E785 Hyperlipidemia, unspecified: Secondary | ICD-10-CM | POA: Diagnosis not present

## 2020-09-27 DIAGNOSIS — E669 Obesity, unspecified: Secondary | ICD-10-CM | POA: Diagnosis not present

## 2020-09-27 DIAGNOSIS — I10 Essential (primary) hypertension: Secondary | ICD-10-CM | POA: Diagnosis not present

## 2020-10-05 ENCOUNTER — Other Ambulatory Visit: Payer: Self-pay | Admitting: Student in an Organized Health Care Education/Training Program

## 2020-10-05 DIAGNOSIS — N529 Male erectile dysfunction, unspecified: Secondary | ICD-10-CM

## 2020-12-01 ENCOUNTER — Other Ambulatory Visit: Payer: Self-pay | Admitting: Family Medicine

## 2020-12-01 DIAGNOSIS — I10 Essential (primary) hypertension: Secondary | ICD-10-CM

## 2021-01-03 DIAGNOSIS — I10 Essential (primary) hypertension: Secondary | ICD-10-CM | POA: Diagnosis not present

## 2021-01-03 DIAGNOSIS — R7303 Prediabetes: Secondary | ICD-10-CM | POA: Diagnosis not present

## 2021-01-03 DIAGNOSIS — R635 Abnormal weight gain: Secondary | ICD-10-CM | POA: Diagnosis not present

## 2021-01-03 DIAGNOSIS — E785 Hyperlipidemia, unspecified: Secondary | ICD-10-CM | POA: Diagnosis not present

## 2021-03-06 ENCOUNTER — Other Ambulatory Visit: Payer: Self-pay | Admitting: Family Medicine

## 2021-03-06 DIAGNOSIS — I10 Essential (primary) hypertension: Secondary | ICD-10-CM

## 2021-03-13 ENCOUNTER — Other Ambulatory Visit: Payer: Self-pay | Admitting: Family Medicine

## 2021-03-13 DIAGNOSIS — N529 Male erectile dysfunction, unspecified: Secondary | ICD-10-CM

## 2021-05-02 DIAGNOSIS — E785 Hyperlipidemia, unspecified: Secondary | ICD-10-CM | POA: Diagnosis not present

## 2021-05-02 DIAGNOSIS — E669 Obesity, unspecified: Secondary | ICD-10-CM | POA: Diagnosis not present

## 2021-05-02 DIAGNOSIS — R7303 Prediabetes: Secondary | ICD-10-CM | POA: Diagnosis not present

## 2021-05-02 DIAGNOSIS — I10 Essential (primary) hypertension: Secondary | ICD-10-CM | POA: Diagnosis not present

## 2021-05-05 ENCOUNTER — Ambulatory Visit (INDEPENDENT_AMBULATORY_CARE_PROVIDER_SITE_OTHER): Payer: BC Managed Care – PPO | Admitting: Family Medicine

## 2021-05-05 VITALS — BP 128/77 | Ht 70.0 in | Wt 280.0 lb

## 2021-05-05 DIAGNOSIS — M1712 Unilateral primary osteoarthritis, left knee: Secondary | ICD-10-CM | POA: Diagnosis not present

## 2021-05-05 MED ORDER — METHYLPREDNISOLONE ACETATE 40 MG/ML IJ SUSP
40.0000 mg | Freq: Once | INTRAMUSCULAR | Status: AC
  Administered 2021-05-05: 40 mg via INTRA_ARTICULAR

## 2021-05-05 NOTE — Patient Instructions (Signed)
Thank you for coming to see me today. It was a pleasure. Today we talked about:   Today you received an injection with corticosteroid. This injection is usually done in response to pain and inflammation. There is some "numbing" medicine also in the shot so the injected area may be numb and feel really good for the next couple of hours. The numbing medicine usually wears off in 2-3 hours though, and then your pain level will be right back where it was before the injection.   The actually benefit from the steroid injection is usually noticed in 2-7 days. You may actually experience a small (as in 10%) INCREASE in pain in the first 24 hours---that is common.   Things to watch out for that you should contact us or a health care provider urgently would include: 1. Unusual (as in more than 10%) increase in pain 2. New fever > 101.5 3. New swelling or redness of the injected area.  4. Streaking of red lines around the area injected.   Please follow-up with us as needed.  If you have any questions or concerns, please do not hesitate to call the office at [phone].  Best,   Reha Martinovich, DO [facility]  

## 2021-05-05 NOTE — Progress Notes (Signed)
PCP: Sabino Dick, DO ? ?Subjective:  ? ?HPI: ?Patient is a 56 y.o. male here for left knee pain. Patient has known OA, predominantly medial compartment bone-on-bone per last xray Feb 2022. He is also s/p TKR of right knee. Patient last received steroid injection to the left knee in August. He states that the pain relief lasted ~6 months, which is similar to the previous injection. L knee started to ache again last month, particularly with changes in the weather. Denies swelling, redness or tenderness to palpation. Desires repeat injection. ? ?Right knee replacement is performing well. No hip pain or other complaints today. ? ?BP 128/77   Ht 5\' 10"  (1.778 m)   Wt 280 lb (127 kg)   BMI 40.18 kg/m?  ? ? ?    ?Objective:  ?Physical Exam: ? ?Gen: NAD, comfortable in exam room ?CV: Regular rate, well perfused ?Resp: No increased work of breathing, coughing or wheezing ?Psych: Normal mood and affect.  ?Left knee: No erythema, swelling, or gross effusion. TTP medial joint line. Moderate patellofemoral crepitus. Full ROM in flexion and extension. 5/5 strength in bilateral knee flexion and extension. Neurovascularly intact distally. Good Stability of ligaments. ? ? ?  ?Assessment & Plan:  ?1. Left knee pain 2/2 primary osteoarthritis: Has done well with injections in the past and had had 6 months of relief.  Discussed risks and benefits of CSI today and he opts to proceed.  3:1 lidocaine to depomedrol injection performed today. 28 ml synovial fluid aspirated from the joint. Last xrays in May 2022 show bone-on-bone in left medial knee joint. S/p right TKR - patient may ultimately need TKR of the left as well. Patient to follow up as needed. ? ?Left Knee ultrasound-guided intraarticular aspiration/injection:  Suprapatellar pouch visualized under ultrasound in transverse axis and marked.  After sterile prep with chlorhexadine, injected 3cc of 1% lidocaine without epinephrine using a 25 gauge 1.5 in needle for  anesthetic.  Then, using an 18 gauge 1.5 inch needle, 28 cc of clear yellow synovial fluid was aspirated from the suprapatellar pouch under ultrasound guidance with good visualization of needle tip.  Through the same needle, 40mg  of methylprednisone and 3 cc of 1% lidocaine without epinephrine was injected with good visualization of needle tip and injectate within the suprapatellar pouch under ultrasound.  Patient tolerated the procedure well.   ? ? ?June 2022 ?MS4, of Medicine ? ? ?Sports Medicine Fellow Addendum ?  ?I have independently interviewed and examined the patient. I have discussed the above with the original author and agree with their documentation. My edits for correction/addition/clarification have been made.   ? ?Festus Aloe, D.O. ?PGY-4, [facility] ?04/12/2021 3:07 PM  ? ?Addendum:  I was the preceptor for this visit and available for immediate consultation.  Luis Abed MD CAQSM ? ? ? ?

## 2021-05-29 DIAGNOSIS — Z6837 Body mass index (BMI) 37.0-37.9, adult: Secondary | ICD-10-CM | POA: Diagnosis not present

## 2021-05-29 DIAGNOSIS — R7303 Prediabetes: Secondary | ICD-10-CM | POA: Diagnosis not present

## 2021-05-29 DIAGNOSIS — E785 Hyperlipidemia, unspecified: Secondary | ICD-10-CM | POA: Diagnosis not present

## 2021-05-29 DIAGNOSIS — I10 Essential (primary) hypertension: Secondary | ICD-10-CM | POA: Diagnosis not present

## 2021-06-01 ENCOUNTER — Other Ambulatory Visit: Payer: Self-pay | Admitting: Family Medicine

## 2021-06-01 DIAGNOSIS — N529 Male erectile dysfunction, unspecified: Secondary | ICD-10-CM

## 2021-06-01 NOTE — Telephone Encounter (Signed)
Attempted to call patient to make an appointment for medication management but the voicemail is full.  Bryan Simmons,CMA ? ?

## 2021-06-02 ENCOUNTER — Other Ambulatory Visit: Payer: Self-pay | Admitting: Family Medicine

## 2021-06-02 DIAGNOSIS — I10 Essential (primary) hypertension: Secondary | ICD-10-CM

## 2021-06-05 ENCOUNTER — Other Ambulatory Visit: Payer: Self-pay | Admitting: Family Medicine

## 2021-06-05 DIAGNOSIS — N529 Male erectile dysfunction, unspecified: Secondary | ICD-10-CM

## 2021-06-16 ENCOUNTER — Ambulatory Visit (INDEPENDENT_AMBULATORY_CARE_PROVIDER_SITE_OTHER): Payer: BC Managed Care – PPO | Admitting: Family Medicine

## 2021-06-16 ENCOUNTER — Encounter: Payer: Self-pay | Admitting: Family Medicine

## 2021-06-16 VITALS — BP 137/70 | HR 67 | Ht 70.0 in | Wt 310.0 lb

## 2021-06-16 DIAGNOSIS — R7303 Prediabetes: Secondary | ICD-10-CM | POA: Diagnosis not present

## 2021-06-16 DIAGNOSIS — I10 Essential (primary) hypertension: Secondary | ICD-10-CM

## 2021-06-16 DIAGNOSIS — N529 Male erectile dysfunction, unspecified: Secondary | ICD-10-CM | POA: Diagnosis not present

## 2021-06-16 MED ORDER — SILDENAFIL CITRATE 100 MG PO TABS: ORAL_TABLET | ORAL | 0 refills | Status: DC

## 2021-06-16 NOTE — Patient Instructions (Addendum)
It was wonderful to see you today. ? ?Please bring ALL of your medications with you to every visit.  ? ?Today we talked about: ? ?Refilled your sildenafil today. Continue current medications for your diabetes and blood pressure. Follow up with Dr. Nita Sells in 1 month for health maintenance items.  ? ?Please be sure to schedule follow up at the front  desk before you leave today.  ? ?Please call the clinic at [phone] if your symptoms worsen or you have any concerns. It was our pleasure to serve you. ? ?Dr. Janus Molder ? ?

## 2021-06-16 NOTE — Progress Notes (Signed)
? ? ?  SUBJECTIVE:  ? ?CHIEF COMPLAINT / HPI:  ? ?-Desires refill on sildenafil today.  ? ?Hypertension ?- Medications: HCTZ 25 mg daily,  ?- Compliance: Yes ?- Checking BP at home: Unknown ?- Denies any SOB, CP, vision changes, LE edema, medication SEs, or symptoms of hypotension ? ?Prediabetes ?Current Regimen: Metformin 500 mg BID ?Last A1c: 6.1 on 4/24  ?Denies polyuria, polydipsia, hypoglycemia  ? ?PERTINENT  PMH / PSH: HLD ? ?OBJECTIVE:  ? ?BP 137/70   Pulse 67   Ht 5\' 10"  (1.778 m)   Wt (!) 310 lb (140.6 kg)   SpO2 96%   BMI 44.48 kg/m?  ?General: Appears well, no acute distress. Age appropriate. ?Cardiac: RRR, normal heart sounds, no murmurs ?Respiratory: CTAB, normal effort ?Extremities: No edema or cyanosis. ?Skin: Warm and dry, no rashes noted ?Neuro: alert and oriented, no focal deficits ?Psych: normal affect ? ?ASSESSMENT/PLAN:  ? ?1. Erectile dysfunction, unspecified erectile dysfunction type ?Desires refill today. Not seen in clinic for 2 years, has had physical with bariatrics last month. Reviewed patient portal on the patient's phone. Encouraged him to follow up with PCP here in 1 month to ensure all health maintenance items are UTD.  ?- sildenafil (VIAGRA) 100 MG tablet; TAKE 1/2 (ONE-HALF) TO 1 TABLET BY MOUTH  ONCE DAILY AS NEEDED FOR  ERECTILE  DYSFUNCTION  Dispense: 30 tablet; Refill: 0 ? ?2. HYPERTENSION, BENIGN ?Well controlled continue current medications.  ? ?3. Prediabetes ?Continue current medications ? ?Bryan Carrick Autry-Lott, DO ?[facility]  ?

## 2021-06-29 ENCOUNTER — Other Ambulatory Visit: Payer: Self-pay | Admitting: Family Medicine

## 2021-06-29 DIAGNOSIS — I10 Essential (primary) hypertension: Secondary | ICD-10-CM

## 2021-07-11 ENCOUNTER — Encounter: Payer: Self-pay | Admitting: *Deleted

## 2021-07-16 NOTE — Progress Notes (Signed)
SUBJECTIVE:   Chief compliant/HPI: annual examination  Bryan Simmons is a 56 y.o. who presents today for an annual exam.  Current concerns: None  Social: lives with wife. Has 4 daughters and 1 son. Youngest daughter still at the house. 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 Drinks on the weekend, 2-3 shots on Fridays and Saturdays. No tobacco, no illicit drug use.  Exercise: Planning to go to the gym 3 times a week for 45 minutes-1 hour.   FHx: Mother passed away from breast cancer, dx at the age of 74 and passed at age 77. Father has prostate cancer and doing well. Sister also passed away from cancer as well- unsure which kind of cancer.   History tabs reviewed and updated.   Review of systems negative.  OBJECTIVE:   BP 131/83   Pulse 89   Wt (!) 310 lb 9.6 oz (140.9 kg)   SpO2 92%   BMI 44.57 kg/m   General: Awake, alert, oriented, in no acute distress, pleasant and cooperative with examination HEENT: Normocephalic, atraumatic, nares patent, dentition is good Cardio: RRR without murmur, 2+ radial pulses b/l Respiratory: CTAB without wheezing/rhonchi/rales Abdomen: Soft, non-tender to palpation of all quadrants, non-distended, no rebound/guarding MSK: Able to move all extremities spontaneously, good muscle strength, no abnormalities Extremities: without edema or cyanosis Neuro: Speech is clear and intact, no focal deficits, no facial asymmetry, follows commands  Psych: Normal mood and affect     07/21/2021   11:23 AM 01/30/2018    3:40 PM 07/26/2017   10:07 AM  Depression screen PHQ 2/9  Decreased Interest 0 0 0  Down, Depressed, Hopeless 0 0 0  PHQ - 2 Score 0 0 0  Altered sleeping 0    Tired, decreased energy 0    Change in appetite 0    Feeling bad or failure about yourself  0    Trouble concentrating 0    Moving slowly or fidgety/restless 0    Suicidal thoughts 0    PHQ-9 Score 0     ASSESSMENT/PLAN:  Zacharee was seen today for follow-up.  Diagnoses and all  orders for this visit:  Encounter for annual physical exam  Prediabetes -     HgB A1c - Continue Metformin  BMI 40.0-44.9, adult (HCC) -     Cancel: Lipid Panel  Encounter for screening colonoscopy -  States that he has had one previously. Unable to see records. He signed a release of information to obtain records to see when he is next due.  Encounter for hepatitis C screening test for low risk patient -     Hepatitis C antibody (reflex, frozen specimen)  Erectile dysfunction, unspecified erectile dysfunction type -     sildenafil (VIAGRA) 100 MG tablet; TAKE 1/2 (ONE-HALF) TO 1 TABLET BY MOUTH  ONCE DAILY AS NEEDED FOR  ERECTILE  DYSFUNCTION  Essential hypertension - Slightly above goal but working towards weight loss and is motivated. Will not adjust medications at this time.       - Refilled: hydrochlorothiazide (HYDRODIURIL) 25 MG tablet; TAKE 1 TABLET BY MOUTH ONCE DAILY  Annual Examination  See AVS for age appropriate recommendations.  PHQ score 0, reviewed and discussed.  Blood pressure value is slightly above goal, discussed.   Considered the following screening exams based upon USPSTF recommendations: Diabetes screening: discussed and ordered; also recently had A1c at bariatric clinic that was 6.1 Screening for elevated cholesterol: discussed and not ordered as recently collected at bariatric clinic  HIV testing: discussed and declined Hepatitis C: discussed and ordered Hepatitis B: discussed and declined Syphilis if at high risk: discussed and declined Reviewed risk factors for latent tuberculosis and not indicated Colorectal cancer screening: discussed, colonoscopy ordered Lung cancer screening:  not indicated   PSA discussed and after engaging in discussion of possible risks, benefits and complications of screening patient declined screening.   Follow up in 1 year or sooner if indicated.   Sabino Dick, DO [facility]

## 2021-07-17 IMAGING — CR DG KNEE STANDING AP BILAT
1 series · 1 of 1 positions shown · non-contrast
Comparison: None.

CLINICAL DATA: left knee pain

EXAM:
BILATERAL KNEES STANDING - 1 VIEW

[w knee ap left *]
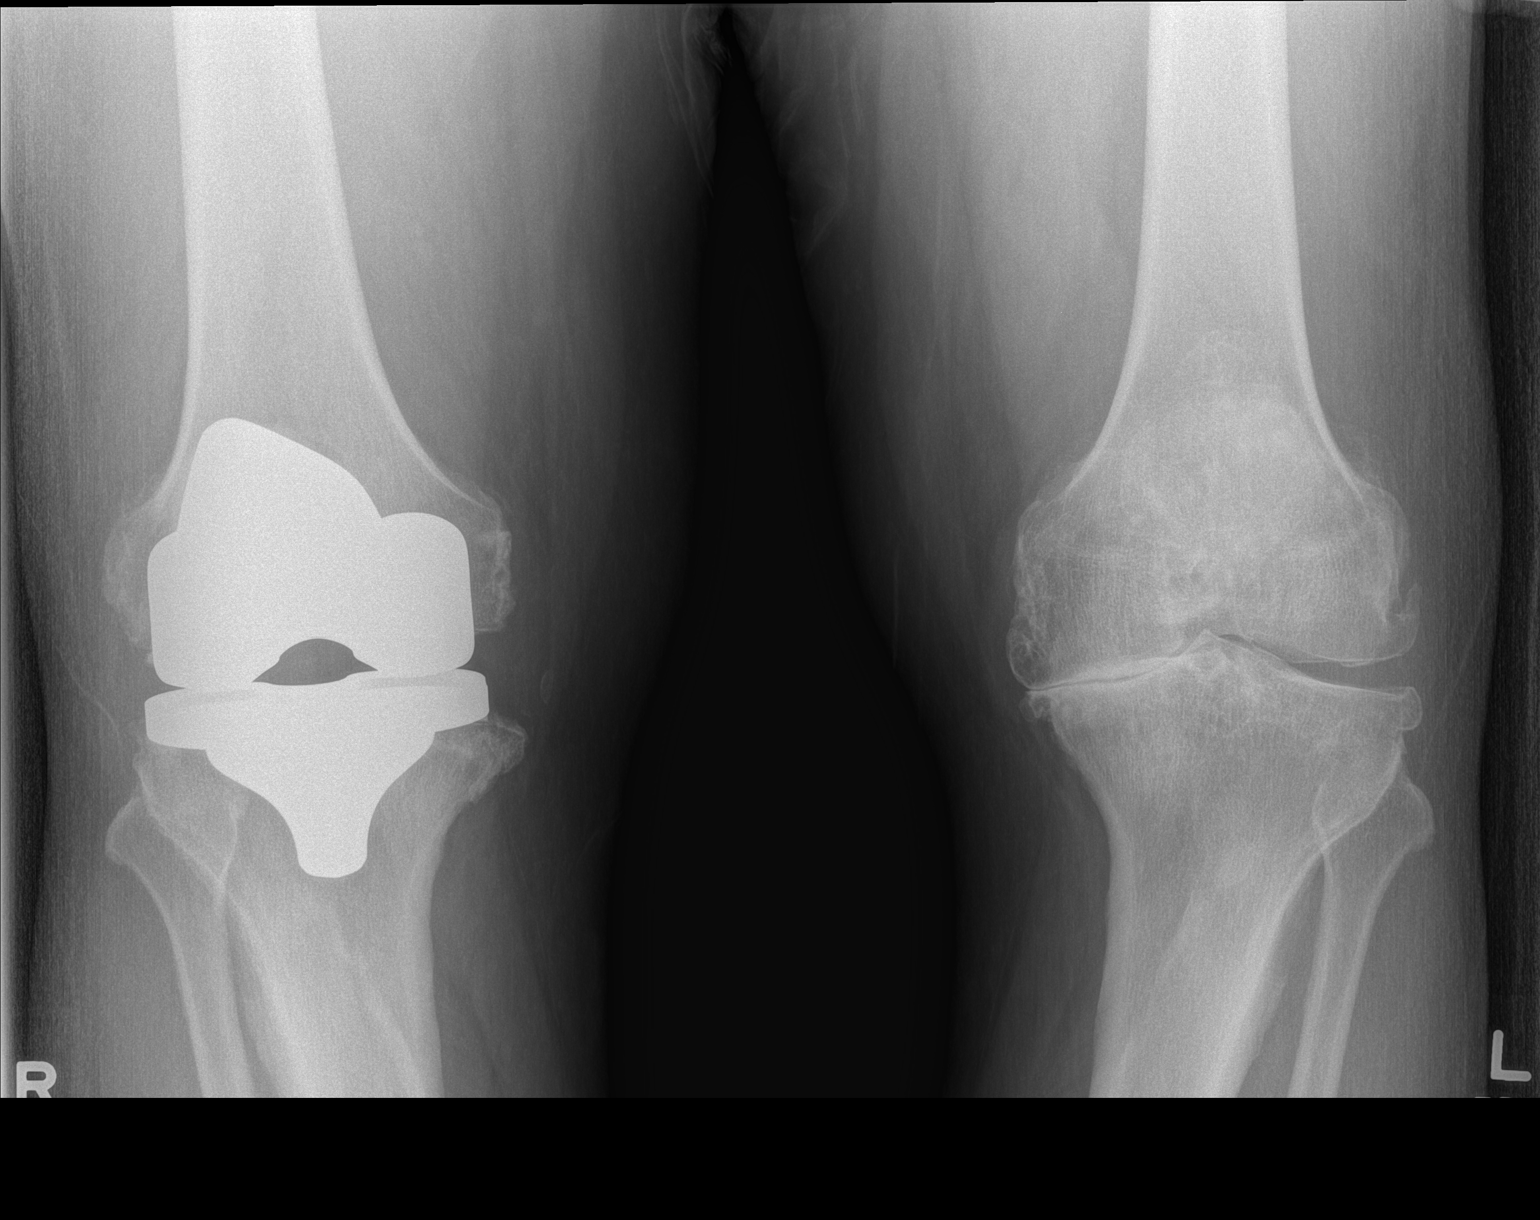

[1 of 1 positions shown; findings below may reference images not displayed]

FINDINGS: Right knee: Sequela of total knee arthroplasty. Hardware components
are well seated and articulate appropriately. No perihardware
lucency. No fracture or focal osseous lesion. Soft tissues within
normal limits.

Left knee: Physiologic alignment. No fracture or focal osseous
lesion. Tricompartmental osteoarthrosis predominantly involving the
medial compartment. Soft tissues within normal limits.
IMPRESSION: Moderate medial predominant left knee osteoarthrosis.

Right total knee arthroplasty without adverse features.

## 2021-07-21 ENCOUNTER — Ambulatory Visit (INDEPENDENT_AMBULATORY_CARE_PROVIDER_SITE_OTHER): Payer: BC Managed Care – PPO | Admitting: Family Medicine

## 2021-07-21 ENCOUNTER — Encounter: Payer: Self-pay | Admitting: Family Medicine

## 2021-07-21 VITALS — BP 131/83 | HR 89 | Wt 310.6 lb

## 2021-07-21 DIAGNOSIS — Z1211 Encounter for screening for malignant neoplasm of colon: Secondary | ICD-10-CM | POA: Diagnosis not present

## 2021-07-21 DIAGNOSIS — I10 Essential (primary) hypertension: Secondary | ICD-10-CM

## 2021-07-21 DIAGNOSIS — N529 Male erectile dysfunction, unspecified: Secondary | ICD-10-CM

## 2021-07-21 DIAGNOSIS — R7303 Prediabetes: Secondary | ICD-10-CM | POA: Diagnosis not present

## 2021-07-21 DIAGNOSIS — Z1159 Encounter for screening for other viral diseases: Secondary | ICD-10-CM | POA: Diagnosis not present

## 2021-07-21 DIAGNOSIS — Z0000 Encounter for general adult medical examination without abnormal findings: Secondary | ICD-10-CM

## 2021-07-21 DIAGNOSIS — Z6841 Body Mass Index (BMI) 40.0 and over, adult: Secondary | ICD-10-CM

## 2021-07-21 LAB — POCT GLYCOSYLATED HEMOGLOBIN (HGB A1C): HbA1c, POC (controlled diabetic range): 5.7 % (ref 0.0–7.0)

## 2021-07-21 MED ORDER — SILDENAFIL CITRATE 100 MG PO TABS: ORAL_TABLET | ORAL | 5 refills | Status: DC

## 2021-07-21 MED ORDER — HYDROCHLOROTHIAZIDE 25 MG PO TABS: ORAL_TABLET | ORAL | 5 refills | Status: DC

## 2021-07-21 NOTE — Patient Instructions (Signed)
It was wonderful to see you today.  Please bring ALL of your medications with you to every visit.   Today we talked about:  Today at your annual preventive visit we talked about the following measures: I recommend 150 minutes of exercise per week-try 30 minutes 5 days per week We discussed reducing sugary beverages (like soda and juice) and increasing leafy greens and whole fruits.  We discussed avoiding tobacco and alcohol.  I recommend avoiding illicit substances.  Your blood pressure is slightly above goal. The goal is <130/80.   Thank you for choosing [facility].   Please call [phone] with any questions about today's appointment.  Please be sure to schedule follow up at the front  desk before you leave today.   Sabino Dick, DO PGY-2 Family Medicine

## 2021-07-21 NOTE — Assessment & Plan Note (Signed)
Goal <130/80. Slightly above goal today. He is motivated to go to the gym and work towards weight loss. Will not adjust meds at this time. -Continue HCTZ; refilled today

## 2021-07-22 LAB — HCV INTERPRETATION

## 2021-07-22 LAB — HCV AB W REFLEX TO QUANT PCR: HCV Ab: NONREACTIVE

## 2021-08-01 DIAGNOSIS — I10 Essential (primary) hypertension: Secondary | ICD-10-CM | POA: Diagnosis not present

## 2021-08-01 DIAGNOSIS — E785 Hyperlipidemia, unspecified: Secondary | ICD-10-CM | POA: Diagnosis not present

## 2021-08-01 DIAGNOSIS — R7303 Prediabetes: Secondary | ICD-10-CM | POA: Diagnosis not present

## 2021-08-01 DIAGNOSIS — E669 Obesity, unspecified: Secondary | ICD-10-CM | POA: Diagnosis not present

## 2021-12-31 ENCOUNTER — Encounter (HOSPITAL_COMMUNITY): Payer: Self-pay | Admitting: Emergency Medicine

## 2021-12-31 ENCOUNTER — Ambulatory Visit (HOSPITAL_COMMUNITY)
Admission: EM | Admit: 2021-12-31 | Discharge: 2021-12-31 | Disposition: A | Discharge status: Home / Self Care | Payer: BC Managed Care – PPO

## 2021-12-31 DIAGNOSIS — M25511 Pain in right shoulder: Secondary | ICD-10-CM

## 2021-12-31 HISTORY — DX: Type 2 diabetes mellitus without complications: E11.9

## 2021-12-31 HISTORY — DX: Essential (primary) hypertension: I10

## 2021-12-31 MED ORDER — CYCLOBENZAPRINE HCL 5 MG PO TABS: 10.0000 mg | ORAL_TABLET | Freq: Three times a day (TID) | ORAL | 0 refills | Status: AC | PRN

## 2021-12-31 MED ORDER — KETOROLAC TROMETHAMINE 30 MG/ML IJ SOLN
INTRAMUSCULAR | Status: AC
  Filled 2021-12-31: qty 1

## 2021-12-31 MED ORDER — KETOROLAC TROMETHAMINE 30 MG/ML IJ SOLN
30.0000 mg | Freq: Once | INTRAMUSCULAR | Status: AC
  Administered 2021-12-31: 30 mg via INTRAMUSCULAR

## 2021-12-31 NOTE — ED Provider Notes (Signed)
[facility]    CSN: [id number] Arrival date & time: 12/31/21  1014      History   Chief Complaint Chief Complaint  Patient presents with   Shoulder Pain    HPI Bryan Simmons is a 56 y.o. male.   Pt complains of right shoulder pain that started about three weeks ago.  Denies injury or trauma.  He reports movement makes the pain worse.  He has experienced right shoulder pain in the past and was seen by ortho, given an intraarticular injection with relief.  He has been apply voltaren gel with improvement. He denies radiation of pain or numbness or tingling.      Past Medical History:  Diagnosis Date   Diabetes mellitus without complication (HCC)    Hypertension     Patient Active Problem List   Diagnosis Date Noted   Primary osteoarthritis of left knee 03/18/2020   Status post total right knee replacement 03/18/2020   Health maintenance examination 12/05/2018   Morbid obesity (HCC) 07/29/2017   Knee pain, bilateral 03/19/2014   Erectile dysfunction 12/25/2011   HLD (hyperlipidemia) 06/20/2011   Prediabetes 06/20/2011   HYPERTENSION, BENIGN 07/19/2006   RHINITIS, ALLERGIC 04/04/2006   Asthma, mild intermittent 04/04/2006    Past Surgical History:  Procedure Laterality Date   REPLACEMENT TOTAL KNEE Right        Home Medications    Prior to Admission medications   Medication Sig Start Date End Date Taking? Authorizing Provider  aspirin EC 81 MG tablet Take 81 mg by mouth daily. Swallow whole.   Yes [provider]  cyclobenzaprine (FLEXERIL) 5 MG tablet Take 2 tablets (10 mg total) by mouth 3 (three) times daily as needed for up to 10 days for muscle spasms. 12/31/21 01/10/22 Yes Ward, Tylene Fantasia, PA-C  atorvastatin (LIPITOR) 40 MG tablet Take 1 tablet by mouth once daily Patient not taking: Reported on 06/16/2021 12/17/19   Leeroy Bock, MD  hydrochlorothiazide (HYDRODIURIL) 25 MG tablet TAKE 1 TABLET BY MOUTH ONCE DAILY 07/21/21    Sabino Dick, DO  metFORMIN (GLUCOPHAGE) 500 MG tablet Take 1 tablet (500 mg total) by mouth 2 (two) times daily with a meal. 09/26/18   Leeroy Bock, MD  sildenafil (VIAGRA) 100 MG tablet TAKE 1/2 (ONE-HALF) TO 1 TABLET BY MOUTH  ONCE DAILY AS NEEDED FOR  ERECTILE  DYSFUNCTION 07/21/21   Sabino Dick, DO    Family History No family history on file.  Social History Social History   Tobacco Use   Smoking status: Never   Smokeless tobacco: Never  Substance Use Topics   Alcohol use: Yes     Allergies   Lisinopril   Review of Systems Review of Systems  Constitutional:  Negative for chills and fever.  HENT:  Negative for ear pain and sore throat.   Eyes:  Negative for pain and visual disturbance.  Respiratory:  Negative for cough and shortness of breath.   Cardiovascular:  Negative for chest pain and palpitations.  Gastrointestinal:  Negative for abdominal pain and vomiting.  Genitourinary:  Negative for dysuria and hematuria.  Musculoskeletal:  Positive for arthralgias (right shoulder pain). Negative for back pain.  Skin:  Negative for color change and rash.  Neurological:  Negative for seizures and syncope.  All other systems reviewed and are negative.    Physical Exam Triage Vital Signs ED Triage Vitals  Enc Vitals Group     BP 12/31/21 1123 133/89     Pulse  Rate 12/31/21 1123 91     Resp 12/31/21 1123 19     Temp 12/31/21 1123 98.1 F (36.7 C)     Temp Source 12/31/21 1123 Oral     SpO2 12/31/21 1123 96 %     Weight --      Height --      Head Circumference --      Peak Flow --      Pain Score 12/31/21 1120 8     Pain Loc --      Pain Edu? --      Excl. in GC? --    No data found.  Updated Vital Signs BP 133/89 (BP Location: Right Arm)   Pulse 91   Temp 98.1 F (36.7 C) (Oral)   Resp 19   SpO2 96%   Visual Acuity Right Eye Distance:   Left Eye Distance:   Bilateral Distance:    Right Eye Near:   Left Eye Near:    Bilateral  Near:     Physical Exam Vitals and nursing note reviewed.  Constitutional:      General: He is not in acute distress.    Appearance: He is well-developed.  HENT:     Head: Normocephalic and atraumatic.  Eyes:     Conjunctiva/sclera: Conjunctivae normal.  Cardiovascular:     Rate and Rhythm: Normal rate and regular rhythm.     Heart sounds: No murmur heard. Pulmonary:     Effort: Pulmonary effort is normal. No respiratory distress.     Breath sounds: Normal breath sounds.  Abdominal:     Palpations: Abdomen is soft.     Tenderness: There is no abdominal tenderness.  Musculoskeletal:        General: No swelling.     Right shoulder: Tenderness present. No swelling. Normal range of motion. Normal pulse.     Cervical back: Neck supple.     Comments: Right trapezius muscle with TTP and spasm as well as TTP to right anterior shoulder   Skin:    General: Skin is warm and dry.     Capillary Refill: Capillary refill takes less than 2 seconds.  Neurological:     Mental Status: He is alert.  Psychiatric:        Mood and Affect: Mood normal.      UC Treatments / Results  Labs (all labs ordered are listed, but only abnormal results are displayed) Labs Reviewed - No data to display  EKG   Radiology No results found.  Procedures Procedures (including critical care time)  Medications Ordered in UC Medications  ketorolac (TORADOL) 30 MG/ML injection 30 mg (30 mg Intramuscular Given 12/31/21 1141)    Initial Impression / Assessment and Plan / UC Course  I have reviewed the triage vital signs and the nursing notes.  Pertinent labs & imaging results that were available during my care of the patient were reviewed by me and considered in my medical decision making (see chart for details).     Right shoulder pain.  Toradol given in clinic today.  Flexeril prescribed to use prn. ROM exercises demonstrated to patient.  Advised follow up with ortho if no improvement.  Final  Clinical Impressions(s) / UC Diagnoses   Final diagnoses:  Acute pain of right shoulder     Discharge Instructions      Can take Flexeril every 8 hours as needed for muscle spasm Can continue with voltaren gel Recommend Tylenol as needed  Recommend range of motion  exercises as discussed Follow up with ortho if no improvement      ED Prescriptions     Medication Sig Dispense Auth. Provider   cyclobenzaprine (FLEXERIL) 5 MG tablet Take 2 tablets (10 mg total) by mouth 3 (three) times daily as needed for up to 10 days for muscle spasms. 30 tablet Ward, Tylene Fantasia, PA-C      PDMP not reviewed this encounter.   Ward, Tylene Fantasia, PA-C 12/31/21 1350

## 2021-12-31 NOTE — Discharge Instructions (Signed)
Can take Flexeril every 8 hours as needed for muscle spasm Can continue with voltaren gel Recommend Tylenol as needed  Recommend range of motion exercises as discussed Follow up with ortho if no improvement

## 2021-12-31 NOTE — ED Triage Notes (Signed)
Pt reports right shoulder pain for 3 weeks. Denies lifting or injury. Pt used cream on shoulder.

## 2022-01-04 ENCOUNTER — Ambulatory Visit: Payer: BC Managed Care – PPO | Admitting: Sports Medicine

## 2022-01-04 VITALS — BP 159/88 | Ht 69.5 in | Wt 305.0 lb

## 2022-01-04 DIAGNOSIS — M25511 Pain in right shoulder: Secondary | ICD-10-CM

## 2022-01-04 MED ORDER — METHYLPREDNISOLONE ACETATE 40 MG/ML IJ SUSP
40.0000 mg | Freq: Once | INTRAMUSCULAR | Status: AC
  Administered 2022-01-04: 40 mg via INTRA_ARTICULAR

## 2022-01-04 NOTE — Progress Notes (Signed)
Bryan Simmons - 56 y.o. male MRN [id number]  Date of birth: 12-18-1965    CHIEF COMPLAINT:   Left knee pain and right shoulder pain    SUBJECTIVE:   HPI:  Pleasant 56 year old male with known history of left knee osteoarthritis comes to be evaluated for left knee pain and right shoulder pain.  Left knee -known medial compartment arthritis.  Bone-on-bone arthritis from x-rays in 2022.  He comes to clinic about twice a year to get corticosteroid injections in his knee.  These seem to be providing him good relief throughout the year.  Unfortunately his pain has returned over the last several weeks.  It is a ache in the front of the knee. He would like to have repeat cortisone in the left knee today.  His last cortisone injection was in March of this year. Of note, he does have a history of right total knee arthroplasty done by Dr. Eulah Pont about 2 years ago.  He is not quite ready to have his left knee replaced yet.     Right shoulder -dull aching pain that has developed in the last 4 weeks.  Located on the lateral aspect of the shoulder.  Atraumatic and insidious in onset.  He went to an urgent care where he was given a shot of Toradol and a muscle relaxer but that is not really helped.  He is finding that the shoulder hurts to sleep on.  It also hurts when he is holding his cell phone, as he does a lot of work on his phone.  He has tried taking Aleve that also has not made much better.  Has not tried any therapies.  ROS:     See HPI  PERTINENT  PMH / PSH FH / / SH:  Past Medical, Surgical, Social, and Family History Reviewed & Updated in the EMR.  Pertinent findings include:  Osteoarthritis left knee  OBJECTIVE: BP (!) 159/88   Ht 5' 9.5" (1.765 m)   Wt (!) 305 lb (138.3 kg)   BMI 44.39 kg/m   Physical Exam:  Vital signs are reviewed.  GEN: Alert and oriented, NAD Pulm: Breathing unlabored PSY: normal mood, congruent affect  MSK: Left knee -no obvious effusion.  No overlying  erythema.  Tender to palpation at the medial joint line.  Nontender palpation lateral joint line.  Nontender palpation over the patella.  Full range of motion.  5/5 strength with resisted extension.  Right shoulder -no obvious deformity.  No overlying erythema.  Nontender to palpation at the clavicle, AC joint.  Mildly tender to palpation over the biceps tendon.  Full range of motion of the shoulder in all directions.  5/5 strength throughout.  Negative Hawkins, negative Neer's.  Negative empty can test.  Negative speeds negative Yergason's.  Neurovascular intact distally.  ASSESSMENT & PLAN:  1.  Left knee medial compartment osteoarthritis -Will proceed with repeat intra-articular corticosteroid injection today.  He tolerated the procedure well.  See note for details.  He can follow-up as needed for this.  I did discuss that if these start wearing off he can follow-up with Dr. Eulah Pont to discuss TKA.  He is not quite ready for that yet but voiced understanding and agreed to the plan.  Procedure performed: Left  knee intraarticular corticosteroid injection; palpation guided  Consent obtained and verified. Time-out conducted. Noted no overlying erythema, induration, or other signs of local infection. The left lateral joint space was palpated and marked. The overlying skin was prepped in a  sterile fashion. Topical analgesic spray: Ethyl chloride. Needle: 21 gauge, 1.5 inch Completed without difficulty. Meds: 40 mg methylrprednisolone, 3 ml 1% lidocaine without epinephrine  Advised to call if fevers/chills, erythema, induration, drainage, or persistent bleeding.   2.  Right shoulder pain -Differential includes glenohumeral arthritis versus rotator cuff tendinopathy.  His range of motion and strength on exam were impressive today so low suspicion for major cuff tear.  Will have him obtain x-rays of his shoulder to evaluate for arthritis given his arthritis in other parts of his body as well.  I  will have him work on home exercise program for rotator cuff strengthening.  If no improvement with this then he may be a candidate for intra-articular or subacromial corticosteroid injection.  He will follow-up in about 4 weeks.  All questions were answered and agrees to plan.  Arvella Nigh, MD PGY-4, Sports Medicine Fellow [facility]  Patient seen and evaluated with the sports medicine fellow.  I agree with the above plan of care.  Cortisone injection administered to the left knee as above.  We will check x-rays of the right shoulder and follow-up with him via telephone or MyChart with those results when available.  Follow-up again in 4 weeks for reevaluation of both of these issues.

## 2022-01-05 ENCOUNTER — Other Ambulatory Visit: Payer: Self-pay | Admitting: Family Medicine

## 2022-01-05 DIAGNOSIS — I10 Essential (primary) hypertension: Secondary | ICD-10-CM

## 2022-01-22 ENCOUNTER — Ambulatory Visit
Admission: RE | Admit: 2022-01-22 | Discharge: 2022-01-22 | Disposition: A | Discharge status: Home / Self Care | Payer: BC Managed Care – PPO | Source: Ambulatory Visit | Attending: Sports Medicine | Admitting: Sports Medicine

## 2022-01-22 DIAGNOSIS — M25511 Pain in right shoulder: Secondary | ICD-10-CM | POA: Diagnosis not present

## 2022-02-06 ENCOUNTER — Ambulatory Visit: Payer: BC Managed Care – PPO | Admitting: Sports Medicine

## 2022-02-06 VITALS — BP 138/88 | Ht 70.0 in | Wt 305.0 lb

## 2022-02-06 DIAGNOSIS — M1712 Unilateral primary osteoarthritis, left knee: Secondary | ICD-10-CM

## 2022-02-06 DIAGNOSIS — M19011 Primary osteoarthritis, right shoulder: Secondary | ICD-10-CM | POA: Diagnosis not present

## 2022-02-06 NOTE — Progress Notes (Cosign Needed)
   Established Patient Office Visit  Subjective   Patient ID: Bryan Simmons, male    DOB: [birth year]  Age: 57 y.o. MRN: [id number]  CC: L knee and R shoulder pain   HPI:  Bryan Simmons is a 57 year old male who presents for follow-up for L knee and R shoulder pain.  L knee pain has improved since receiving intraarticular CSI on 11/30. Now only experiencing pain when it's raining or when getting up after sitting for prolonged period. No pain with walking or at rest. R shoulder pain has also improved since starting exercises for rotator cuff strengthening as well as having the last 11 days off work. Shoulder pain no longer waking him up at night. Not currently working out but planning to restart soon.     Objective:     BP 138/88   Ht 5\' 10"  (1.778 m)   Wt (!) 305 lb (138.3 kg)   BMI 43.76 kg/m  Vital signs reviewed.   Physical Exam General: Pleasant, well-appearing  MSK:  L knee  Inspection: No obvious effusion, erythema, or ecchymosis.  Palpation: No tenderness to palpation of patella, medial, or lateral joint line.  ROM: Full flexion, extension, and lateral rotation. Knee extension strength 5/5.  R shoulder Inspection: No visible deformity or erythema. Palpation: Mild tenderness to palpation at [facility] joint with cross-arm adduction test  ROM: Full ROM. Intact strength throughout. Negative empty can test.   Imaging Right Shoulder X-Ray  01/22/22 1. 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 2. Moderate acromioclavicular osteoarthritis.      Assessment & Plan:   Problem List Items Addressed This Visit       Musculoskeletal and Integument   Arthritis of shoulder region, right    Patient with improved R shoulder pain after rotator cuff strengthening exercises and rest. Physical exam and x-rays consistent with glenohumeral and AC osteoarthritis. Discussed avoiding going past shoulder level in workouts.  - Continue rotator cuff exercises  - Follow-up as needed        Primary osteoarthritis of left knee - Primary    L knee pain improved after corticosteroid injection on 01/04/2022.  - Follow-up as needed       No follow-ups on file.    Stefani Dama, Medical Student  Patient seen and evaluated with the medical student.  I agree with the above plan of care.  Bryan Simmons is doing better with both his left knee and his right shoulder.  X-rays of the right shoulder do show some mild to moderate glenohumeral osteoarthritis and AC osteoarthritis.  His clinical exam today suggest his pain generator to be his [facility] joint but given the fact that he is doing better we will hold on further treatment at this time.  If his pain once again worsens, we could consider the merits of a diagnostic/therapeutic AC joint injection.  He will follow-up for ongoing or recalcitrant issues.

## 2022-02-06 NOTE — Assessment & Plan Note (Addendum)
Patient with improved R shoulder pain after rotator cuff strengthening exercises and rest. Physical exam and x-rays consistent with glenohumeral and AC osteoarthritis. Discussed avoiding going past shoulder level in workouts.  - Continue rotator cuff exercises  - Follow-up as needed

## 2022-02-06 NOTE — Assessment & Plan Note (Signed)
L knee pain improved after corticosteroid injection on 01/04/2022.  - Follow-up as needed

## 2022-04-11 ENCOUNTER — Other Ambulatory Visit: Payer: Self-pay | Admitting: Family Medicine

## 2022-04-11 DIAGNOSIS — R7303 Prediabetes: Secondary | ICD-10-CM

## 2022-04-11 MED ORDER — METFORMIN HCL 500 MG PO TABS: 500.0000 mg | ORAL_TABLET | Freq: Two times a day (BID) | ORAL | 0 refills | Status: DC

## 2022-05-20 ENCOUNTER — Ambulatory Visit (INDEPENDENT_AMBULATORY_CARE_PROVIDER_SITE_OTHER): Payer: BC Managed Care – PPO

## 2022-05-20 ENCOUNTER — Encounter (HOSPITAL_COMMUNITY): Payer: Self-pay

## 2022-05-20 ENCOUNTER — Ambulatory Visit (HOSPITAL_COMMUNITY)
Admission: EM | Admit: 2022-05-20 | Discharge: 2022-05-20 | Disposition: A | Discharge status: Home / Self Care | Payer: BC Managed Care – PPO | Attending: Emergency Medicine | Admitting: Emergency Medicine

## 2022-05-20 DIAGNOSIS — Q6689 Other  specified congenital deformities of feet: Secondary | ICD-10-CM

## 2022-05-20 DIAGNOSIS — S93401A Sprain of unspecified ligament of right ankle, initial encounter: Secondary | ICD-10-CM

## 2022-05-20 DIAGNOSIS — M25571 Pain in right ankle and joints of right foot: Secondary | ICD-10-CM

## 2022-05-20 DIAGNOSIS — R600 Localized edema: Secondary | ICD-10-CM | POA: Diagnosis not present

## 2022-05-20 NOTE — Discharge Instructions (Addendum)
Your xray was negative for fracture. Rest,ice,wear ace wrap. You have degenerative changes of ankle/foot. Please follow up with orthopedics of your choice for further management of Talocalcaneal coalition. Return as needed. Take tylenol for pain as label directed.

## 2022-05-20 NOTE — ED Triage Notes (Signed)
Pt reports right ankle pain and swelling. Denies any falls or trauma to his ankle.

## 2022-05-20 NOTE — ED Provider Notes (Signed)
[facility]    CSN: [id number] Arrival date & time: 05/20/22  1127      History   Chief Complaint Chief Complaint  Patient presents with   Ankle Pain    HPI GENE WEATHERBY is a 56 y.o. male.   57 year old male, Woodley Landt, presents to urgent care chief complaint of right ankle pain and swelling," think accidentally sprained it".  Patient has been using lidocaine spray, Ace wrap for pain management.  The history is provided by the patient. No language interpreter was used.    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   Sprain of right ankle 05/20/2022   Arthritis of shoulder region, right 02/06/2022   Primary osteoarthritis of left knee 03/18/2020   Status post total right knee replacement 03/18/2020   Health maintenance examination 12/05/2018   Morbid obesity 07/29/2017   Knee pain, bilateral 03/19/2014   Erectile dysfunction 12/25/2011   HLD (hyperlipidemia) 06/20/2011   Prediabetes 06/20/2011   HYPERTENSION, BENIGN 07/19/2006   RHINITIS, ALLERGIC 04/04/2006   Asthma, mild intermittent 04/04/2006    Past Surgical History:  Procedure Laterality Date   REPLACEMENT TOTAL KNEE Right        Home Medications    Prior to Admission medications   Medication Sig Start Date End Date Taking? Authorizing Provider  aspirin EC 81 MG tablet Take 81 mg by mouth daily. Swallow whole.   Yes [provider]  atorvastatin (LIPITOR) 40 MG tablet Take 1 tablet by mouth once daily 12/17/19  Yes Leeroy Bock, MD  metFORMIN (GLUCOPHAGE) 500 MG tablet Take 1 tablet (500 mg total) by mouth 2 (two) times daily with a meal. 04/11/22  Yes Sabino Dick, DO  hydrochlorothiazide (HYDRODIURIL) 25 MG tablet Take 1 tablet by mouth once daily 01/08/22   Sabino Dick, DO  sildenafil (VIAGRA) 100 MG tablet TAKE  1/2 (ONE-HALF) TO 1 TABLET BY MOUTH  ONCE DAILY AS NEEDED FOR  ERECTILE  DYSFUNCTION 07/21/21   Sabino Dick, DO    Family History History reviewed. No pertinent family history.  Social History Social History   Tobacco Use   Smoking status: Never   Smokeless tobacco: Never  Substance Use Topics   Alcohol use: Yes     Allergies   Lisinopril   Review of Systems Review of Systems  Musculoskeletal:  Positive for arthralgias and joint swelling.  Skin:  Negative for color change and wound.  All other systems reviewed and are negative.    Physical Exam Triage Vital Signs ED Triage Vitals [05/20/22 1306]  Enc Vitals Group     BP 114/74     Pulse Rate (!) 101     Resp 18     Temp 98.3 F (36.8 C)     Temp Source Oral     SpO2 95 %     Weight      Height      Head Circumference      Peak Flow      Pain Score      Pain Loc      Pain Edu?      Excl. in GC?    No data found.  Updated Vital Signs BP 114/74 (BP Location: Left Arm)   Pulse (!) 101   Temp 98.3 F (36.8 C) (Oral)   Resp 18  SpO2 95%   Visual Acuity Right Eye Distance:   Left Eye Distance:   Bilateral Distance:    Right Eye Near:   Left Eye Near:    Bilateral Near:     Physical Exam Vitals and nursing note reviewed.  Constitutional:      General: He is not in acute distress.    Appearance: He is well-developed.  HENT:     Head: Normocephalic and atraumatic.  Eyes:     Conjunctiva/sclera: Conjunctivae normal.  Cardiovascular:     Rate and Rhythm: Normal rate and regular rhythm.     Heart sounds: No murmur heard. Pulmonary:     Effort: Pulmonary effort is normal. No respiratory distress.     Breath sounds: Normal breath sounds.  Abdominal:     Palpations: Abdomen is soft.     Tenderness: There is no abdominal tenderness.  Musculoskeletal:        General: No swelling.     Cervical back: Neck supple.     Right ankle: Swelling present. No deformity, ecchymosis or lacerations.  Normal range of motion. Normal pulse.       Legs:  Skin:    General: Skin is warm and dry.     Capillary Refill: Capillary refill takes less than 2 seconds.  Neurological:     Mental Status: He is alert.  Psychiatric:        Mood and Affect: Mood normal.        Behavior: Behavior is cooperative.      UC Treatments / Results  Labs (all labs ordered are listed, but only abnormal results are displayed) Labs Reviewed - No data to display  EKG   Radiology DG Ankle Complete Right  Result Date: 05/20/2022 CLINICAL DATA:  Right ankle pain and swelling.  No known injury EXAM: RIGHT ANKLE - COMPLETE 3 VIEW COMPARISON:  None Available. FINDINGS: No acute fracture or dislocation. There is moderate joint space narrowing and osteophytosis of the tibiotalar joint and midfoot joints. 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 Mild diffuse soft tissue edema. IMPRESSION: Moderate degenerative changes of the ankle and midfoot, with signs suggestive of talocalcaneal coalition. Electronically Signed   By: Jacob Moores M.D.   On: 05/20/2022 13:44    Procedures Procedures (including critical care time)  Medications Ordered in UC Medications - No data to display  Initial Impression / Assessment and Plan / UC Course  I have reviewed the triage vital signs and the nursing notes.  Pertinent labs & imaging results that were available during my care of the patient were reviewed by me and considered in my medical decision making (see chart for details).    Rest ice Ace wrap elevate, no fracture noted, refer to orthopedics for further evaluation.  Patient verbalized understanding this provider  Ddx: Talocalcaneal coalition, right ankle sprain Final Clinical Impressions(s) / UC Diagnoses   Final diagnoses:  Coalition, talocalcaneal  Sprain of right ankle, unspecified ligament, initial  encounter     Discharge Instructions      Your xray was negative for fracture. Rest,ice,wear ace wrap. You have degenerative changes of ankle/foot. Please follow up with orthopedics of your choice for further management of Talocalcaneal coalition. Return as needed. Take tylenol for pain as label directed.     ED Prescriptions   None    PDMP not reviewed this encounter.   Clancy Gourd, NP 05/20/22 [phone]

## 2022-07-08 ENCOUNTER — Other Ambulatory Visit: Payer: Self-pay | Admitting: Family Medicine

## 2022-07-08 DIAGNOSIS — I10 Essential (primary) hypertension: Secondary | ICD-10-CM

## 2022-07-16 ENCOUNTER — Other Ambulatory Visit: Payer: Self-pay | Admitting: Family Medicine

## 2022-07-16 DIAGNOSIS — R7303 Prediabetes: Secondary | ICD-10-CM

## 2022-07-25 ENCOUNTER — Encounter: Payer: Self-pay | Admitting: Family Medicine

## 2022-07-26 ENCOUNTER — Other Ambulatory Visit: Payer: Self-pay | Admitting: Family Medicine

## 2022-07-26 DIAGNOSIS — R7303 Prediabetes: Secondary | ICD-10-CM

## 2022-07-26 MED ORDER — METFORMIN HCL 500 MG PO TABS: 500.0000 mg | ORAL_TABLET | Freq: Two times a day (BID) | ORAL | 0 refills | Status: DC

## 2022-07-26 NOTE — Telephone Encounter (Signed)
30-day supply sent. Encouraged patient to keep appt on 7/11

## 2022-08-16 ENCOUNTER — Ambulatory Visit: Payer: BC Managed Care – PPO | Admitting: Family Medicine

## 2022-08-16 ENCOUNTER — Other Ambulatory Visit: Payer: Self-pay

## 2022-08-16 VITALS — BP 126/72 | HR 89 | Ht 70.0 in | Wt 332.8 lb

## 2022-08-16 DIAGNOSIS — I10 Essential (primary) hypertension: Secondary | ICD-10-CM

## 2022-08-16 DIAGNOSIS — M25561 Pain in right knee: Secondary | ICD-10-CM

## 2022-08-16 DIAGNOSIS — R7303 Prediabetes: Secondary | ICD-10-CM

## 2022-08-16 DIAGNOSIS — M25562 Pain in left knee: Secondary | ICD-10-CM

## 2022-08-16 DIAGNOSIS — J4530 Mild persistent asthma, uncomplicated: Secondary | ICD-10-CM

## 2022-08-16 DIAGNOSIS — J4520 Mild intermittent asthma, uncomplicated: Secondary | ICD-10-CM

## 2022-08-16 DIAGNOSIS — G8929 Other chronic pain: Secondary | ICD-10-CM

## 2022-08-16 DIAGNOSIS — N529 Male erectile dysfunction, unspecified: Secondary | ICD-10-CM

## 2022-08-16 DIAGNOSIS — E785 Hyperlipidemia, unspecified: Secondary | ICD-10-CM | POA: Diagnosis not present

## 2022-08-16 LAB — POCT GLYCOSYLATED HEMOGLOBIN (HGB A1C): Hemoglobin A1C: 6.1 % — AB (ref 4.0–5.6)

## 2022-08-16 MED ORDER — METFORMIN HCL 500 MG PO TABS
500.0000 mg | ORAL_TABLET | Freq: Two times a day (BID) | ORAL | 12 refills | Status: DC
Start: 2022-08-16 — End: 2022-09-14

## 2022-08-16 MED ORDER — SILDENAFIL CITRATE 100 MG PO TABS
ORAL_TABLET | ORAL | 1 refills | Status: DC
Start: 2022-08-16 — End: 2023-08-20

## 2022-08-16 MED ORDER — ALBUTEROL SULFATE HFA 108 (90 BASE) MCG/ACT IN AERS
2.0000 | INHALATION_SPRAY | Freq: Four times a day (QID) | RESPIRATORY_TRACT | 5 refills | Status: DC | PRN
Start: 2022-08-16 — End: 2022-08-23

## 2022-08-16 MED ORDER — HYDROCHLOROTHIAZIDE 25 MG PO TABS
ORAL_TABLET | ORAL | 11 refills | Status: DC
Start: 2022-08-16 — End: 2023-08-22

## 2022-08-16 MED ORDER — FLUTICASONE PROPIONATE HFA 110 MCG/ACT IN AERO
1.0000 | INHALATION_SPRAY | Freq: Two times a day (BID) | RESPIRATORY_TRACT | 12 refills | Status: DC
Start: 2022-08-16 — End: 2022-08-20

## 2022-08-16 NOTE — Progress Notes (Signed)
    SUBJECTIVE:   CHIEF COMPLAINT / HPI:   Patient is here to establish care with new PCP as well as review medications and refills. Patient reports he has no complaints at this other than his allergies bothering him more. Patient would like to refill albuterol inhaler. Patient states he uses his albuterol inhaler 2/3 times a week. Patient reports he used to be on a steroid inhaler, but ran out and has not continued.   Patient also has bilateral knee pain. He reports he had a knee replacement on the R knee but the L knee has been bothering him more. He reports he has heard of injections but is hesitant because he does not think they work. Patient reports he is getting back to the gym despite the pain and is motivated to lose weight.   Patient reports he is compliant on hydrochlorothiazide. Patient also reports he is taking his metformin and sildnafil.  Patient was started on aspirin and atorvastatin by prior doctor but he is not sure why. He takes his aspirin but does not take Lipitor.    PERTINENT  PMH / PSH:   Patient has history of prediabetes, HTN, bilateral knee pain s/p total R knee replacement and erectile dysfunction.   OBJECTIVE:   BP 126/72   Pulse 89   Ht 5\' 10"  (1.778 m)   Wt (!) 332 lb 12.8 oz (151 kg)   SpO2 95%   BMI 47.75 kg/m   General: A&O, NAD HEENT: No sign of trauma, EOM grossly intact Cardiac: RRR, no m/r/g Respiratory: normal WOB, slight end expiratory wheezing at lung apices  GI: Soft, NTTP, non-distended  Extremities: NTTP, mild pitting edema to bilateral legs. Patient has knee replacement with scar on R knee. Patient wears brace on L knee.  Neuro: Moves all four extremities appropriately. Psych: Appropriate mood and affect   ASSESSMENT/PLAN:   HYPERTENSION, BENIGN Patients blood pressure was normotensive in clinic. Patient has long history on hydrochlorothiazide. Will continue patient on this medication at current dose.   Asthma, mild  persistent Patient reports he has been using his albuterol inhaler 2-3 times a week with increased SOB and cough. Patient reports he used to take steroid inhaler but has not done so in a while. - refilled patients albuterol  - started patient on flovent BID, will follow up in 2-3 weeks  Prediabetes Patients last A1C was in 2017, 5.9. Repeated A1C today, 6.0. Discussed continuing exercising and eating varied, healthy diet.   HLD (hyperlipidemia) Patient was prescribed atorvastatin in the past but has not been taking it. Will repeat lipid panel today.   Knee pain, bilateral Patient is s/p total R knee replacement. He endorses pain in his left knee as well. Discussed possibly doing corticosteroid injections in clinic, patients states he will consider.      Franshesca Chipman, MD [facility]

## 2022-08-16 NOTE — Patient Instructions (Addendum)
It was wonderful to see you today.  Please bring ALL of your medications with you to every visit.   Today we talked about:  Your blood pressure looked good today. We checked your lab work and your A1C was 6.1. We refilled your albuterol and added a steroid inhaler. We also refilled your other medications.   Thank you for choosing [facility].   Please call [phone] with any questions about today's appointment.  Please arrive at least 15 minutes prior to your scheduled appointments.   If you had blood work today, I will send you a MyChart message or a letter if results are normal. Otherwise, I will give you a call.   If you had a referral placed, they will call you to set up an appointment. Please give [place] a call if you don't hear back in the next 2 weeks.   If you need additional refills before your next appointment, please call your pharmacy first.   Bryan Morales, MD Family Medicine

## 2022-08-17 DIAGNOSIS — J4530 Mild persistent asthma, uncomplicated: Secondary | ICD-10-CM | POA: Insufficient documentation

## 2022-08-17 LAB — BASIC METABOLIC PANEL
BUN/Creatinine Ratio: 19 (ref 9–20)
BUN: 19 mg/dL (ref 6–24)
CO2: 27 mmol/L (ref 20–29)
Calcium: 9.7 mg/dL (ref 8.7–10.2)
Chloride: 100 mmol/L (ref 96–106)
Creatinine, Ser: 0.98 mg/dL (ref 0.76–1.27)
Glucose: 97 mg/dL (ref 70–99)
Potassium: 4 mmol/L (ref 3.5–5.2)
Sodium: 142 mmol/L (ref 134–144)
eGFR: 90 mL/min/{1.73_m2} (ref >59)

## 2022-08-17 LAB — LIPID PANEL
Chol/HDL Ratio: 3.3 ratio (ref 0.0–5.0)
Cholesterol, Total: 197 mg/dL (ref 100–199)
HDL: 59 mg/dL (ref >39)
LDL Chol Calc (NIH): 120 mg/dL — ABNORMAL HIGH (ref 0–99)
Triglycerides: 103 mg/dL (ref 0–149)
VLDL Cholesterol Cal: 18 mg/dL (ref 5–40)

## 2022-08-17 NOTE — Assessment & Plan Note (Deleted)
Patients blood pressure was normotensive in clinic. Patient has long history on hydrochlorothiazide. Will continue patient on this medication at current dose.

## 2022-08-17 NOTE — Assessment & Plan Note (Signed)
Patient is s/p total R knee replacement. He endorses pain in his left knee as well. Discussed possibly doing corticosteroid injections in clinic, patients states he will consider.

## 2022-08-17 NOTE — Assessment & Plan Note (Signed)
Patient was prescribed atorvastatin in the past but has not been taking it. Will repeat lipid panel today.

## 2022-08-17 NOTE — Assessment & Plan Note (Signed)
Patient reports he has been using his albuterol inhaler 2-3 times a week with increased SOB and cough. Patient reports he used to take steroid inhaler but has not done so in a while. - refilled patients albuterol  - started patient on flovent BID, will follow up in 2-3 weeks

## 2022-08-17 NOTE — Assessment & Plan Note (Signed)
Patients last A1C was in 2017, 5.9. Repeated A1C today, 6.0. Discussed continuing exercising and eating varied, healthy diet.

## 2022-08-17 NOTE — Assessment & Plan Note (Signed)
Patients blood pressure was normotensive in clinic. Patient has long history on hydrochlorothiazide. Will continue patient on this medication at current dose.

## 2022-08-18 ENCOUNTER — Encounter: Payer: Self-pay | Admitting: Family Medicine

## 2022-08-18 DIAGNOSIS — J4520 Mild intermittent asthma, uncomplicated: Secondary | ICD-10-CM

## 2022-08-20 ENCOUNTER — Telehealth: Payer: Self-pay

## 2022-08-20 ENCOUNTER — Other Ambulatory Visit (HOSPITAL_COMMUNITY): Payer: Self-pay

## 2022-08-20 MED ORDER — FLUTICASONE PROPIONATE HFA 110 MCG/ACT IN AERO: 1.0000 | INHALATION_SPRAY | Freq: Two times a day (BID) | RESPIRATORY_TRACT | 12 refills | Status: DC

## 2022-08-20 NOTE — Telephone Encounter (Signed)
Pharmacy Patient Advocate Encounter   Received notification from Fax that prior authorization for Flovent HFA is required/requested.   Insurance verification completed.    The patient is insured through [employer] .   Per test claim:  ADVAIR HFA, AIRDUO RESPICLICK, ALVESCO, ARNUITY ELLIPTA, ASMANEX, ASMANEX TWISTHALER , BREZTRI AEROSPHERE, BUDESONIDE 0.25MG  & 0.5MG . DULERA, FLUTICASONE PROPIONATE DISKUS, FLUTICASONE PROPIONATE HFA, PULMICORT FLEXHALER, QVAR REDIHALER, SINGULAIR, & SPIRIVA RESPIMAT 1.25 MCG is preferred by the ins.  If suggested medication is appropriate, Please send in a new RX and discontinue this one. If not, please advise as to why it's not appropriate so that we may request a Prior Authorization.

## 2022-08-20 NOTE — Telephone Encounter (Signed)
Will resend to [employer].

## 2022-08-23 ENCOUNTER — Other Ambulatory Visit: Payer: Self-pay | Admitting: Family Medicine

## 2022-08-23 DIAGNOSIS — J4530 Mild persistent asthma, uncomplicated: Secondary | ICD-10-CM

## 2022-08-23 MED ORDER — FLUTICASONE-SALMETEROL 100-50 MCG/ACT IN AEPB: 1.0000 | INHALATION_SPRAY | Freq: Two times a day (BID) | RESPIRATORY_TRACT | 3 refills | Status: DC

## 2022-08-23 NOTE — Progress Notes (Signed)
Insurance did not cover ventolin, will try alternative medication.

## 2022-08-24 ENCOUNTER — Other Ambulatory Visit (HOSPITAL_COMMUNITY): Payer: Self-pay

## 2022-08-24 NOTE — Telephone Encounter (Signed)
Medication changed to Advair

## 2022-09-14 ENCOUNTER — Other Ambulatory Visit: Payer: Self-pay | Admitting: Family Medicine

## 2022-09-14 ENCOUNTER — Encounter: Payer: Self-pay | Admitting: Family Medicine

## 2022-09-14 DIAGNOSIS — R7303 Prediabetes: Secondary | ICD-10-CM

## 2022-09-14 MED ORDER — METFORMIN HCL 500 MG PO TABS
500.0000 mg | ORAL_TABLET | Freq: Two times a day (BID) | ORAL | 1 refills | Status: DC
Start: 2022-09-14 — End: 2023-09-13

## 2022-09-14 NOTE — Progress Notes (Signed)
Refilled patient's metformin. 

## 2022-11-12 ENCOUNTER — Encounter (HOSPITAL_COMMUNITY): Payer: Self-pay | Admitting: Emergency Medicine

## 2022-11-12 ENCOUNTER — Ambulatory Visit (HOSPITAL_COMMUNITY)
Admission: EM | Admit: 2022-11-12 | Discharge: 2022-11-12 | Disposition: A | Discharge status: Home / Self Care | Payer: BC Managed Care – PPO

## 2022-11-12 DIAGNOSIS — H6123 Impacted cerumen, bilateral: Secondary | ICD-10-CM | POA: Diagnosis not present

## 2022-11-12 DIAGNOSIS — H8113 Benign paroxysmal vertigo, bilateral: Secondary | ICD-10-CM | POA: Diagnosis not present

## 2022-11-12 MED ORDER — MECLIZINE HCL 25 MG PO TABS: 25.0000 mg | ORAL_TABLET | Freq: Three times a day (TID) | ORAL | 0 refills | Status: AC | PRN

## 2022-11-12 MED ORDER — CARBAMIDE PEROXIDE 6.5 % OT SOLN: 10.0000 [drp] | Freq: Every day | OTIC | 0 refills | Status: AC

## 2022-11-12 NOTE — Discharge Instructions (Signed)
Use Debrox eardrops in both ears daily for 4 days to help break up earwax. You can take meclizine every 8 hours as needed for dizziness. This can make you drowsy so please do not drive or work while taking.  If your symptoms persist you can return here for reevaluation or follow-up with primary care provider.  If you develop severe headache, blurred vision, weakness, numbness, or confusion please seek immediate medical treatment in the ER.

## 2022-11-12 NOTE — ED Provider Notes (Signed)
[facility]    CSN: [id number] Arrival date & time: 11/12/22  0913      History   Chief Complaint Chief Complaint  Patient presents with   Dizziness    HPI Bryan Simmons is a 57 y.o. male.   Patient presents with dizziness since Saturday night.  Patient reports the room spins when laying down or bending over.  Denies headache, blurred vision, nausea, vomiting, weakness, numbness, and confusion.   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   Sprain of right ankle 05/20/2022   Arthritis of shoulder region, right 02/06/2022   Primary osteoarthritis of left knee 03/18/2020   Status post total right knee replacement 03/18/2020   Health maintenance examination 12/05/2018   Morbid obesity (HCC) 07/29/2017   Knee pain, bilateral 03/19/2014   Erectile dysfunction 12/25/2011   HLD (hyperlipidemia) 06/20/2011   Prediabetes 06/20/2011   HYPERTENSION, BENIGN 07/19/2006   RHINITIS, ALLERGIC 04/04/2006   Asthma, mild intermittent 04/04/2006    Past Surgical History:  Procedure Laterality Date   REPLACEMENT TOTAL KNEE Right        Home Medications    Prior to Admission medications   Medication Sig Start Date End Date Taking? Authorizing Provider  carbamide peroxide (DEBROX) 6.5 % OTIC solution Place 10 drops into both ears daily for 4 days. 11/12/22 11/16/22 Yes Wynonia Lawman A, NP  meclizine (ANTIVERT) 25 MG tablet Take 1 tablet (25 mg total) by mouth 3 (three) times daily as needed for dizziness. 11/12/22  Yes Susann Givens, Anthonella Klausner A, NP  albuterol (VENTOLIN HFA) 108 (90 Base) MCG/ACT inhaler Inhale 2 puffs into the lungs every 6 (six) hours as needed.    [provider]   aspirin EC 81 MG tablet Take 81 mg by mouth daily. Swallow whole.    [provider]  atorvastatin (LIPITOR) 40 MG tablet Take 1 tablet by mouth once daily Patient not taking: Reported on 08/16/2022 12/17/19   Leeroy Bock, MD  fluticasone-salmeterol (ADVAIR) 100-50 MCG/ACT AEPB Inhale 1 puff into the lungs 2 (two) times daily. 08/23/22   Baloch, Mahnoor, MD  hydrochlorothiazide (HYDRODIURIL) 25 MG tablet TAKE 1 TABLET BY MOUTH ONCE DAILY 08/16/22   Baloch, Mahnoor, MD  metFORMIN (GLUCOPHAGE) 500 MG tablet Take 1 tablet (500 mg total) by mouth 2 (two) times daily with a meal. 09/14/22 10/14/22  Baloch, Mahnoor, MD  sildenafil (VIAGRA) 100 MG tablet TAKE 1/2 (ONE-HALF) TO 1 TABLET BY MOUTH  ONCE DAILY AS NEEDED FOR  ERECTILE  DYSFUNCTION 08/16/22   Penne Lash, MD    Family History History reviewed. No pertinent family history.  Social History Social History   Tobacco Use   Smoking status: Never   Smokeless tobacco: Never  Vaping Use   Vaping status: Never Used  Substance Use Topics   Alcohol use: Yes   Drug use: Never     Allergies   Lisinopril   Review of Systems Review of Systems  Constitutional:  Negative for chills, fatigue and fever.  Eyes:  Negative for photophobia and visual disturbance.  Gastrointestinal:  Negative for nausea and vomiting.  Neurological:  Positive for dizziness. Negative for weakness, light-headedness and headaches.     Physical Exam Triage  Vital Signs ED Triage Vitals  Encounter Vitals Group     BP 11/12/22 0933 118/80     Systolic BP Percentile --      Diastolic BP Percentile --      Pulse Rate 11/12/22 0933 96     Resp 11/12/22 0933 16     Temp 11/12/22 0933 98.6 F (37 C)     Temp Source 11/12/22 0933 Oral     SpO2 11/12/22 0933 92 %     Weight --      Height --      Head Circumference --      Peak Flow --      Pain Score 11/12/22 0930 0     Pain Loc --      Pain Education --      Exclude from Growth Chart --     No data found.  Updated Vital Signs BP 118/80 (BP Location: Left Arm)   Pulse 96   Temp 98.6 F (37 C) (Oral)   Resp 16   SpO2 92%   Visual Acuity Right Eye Distance:   Left Eye Distance:   Bilateral Distance:    Right Eye Near:   Left Eye Near:    Bilateral Near:     Physical Exam Vitals and nursing note reviewed.  Constitutional:      General: He is awake. He is not in acute distress.    Appearance: Normal appearance. He is well-developed and well-groomed. He is not ill-appearing, toxic-appearing or diaphoretic.  HENT:     Right Ear: There is impacted cerumen.     Left Ear: There is impacted cerumen.  Skin:    General: Skin is warm and dry.  Neurological:     General: No focal deficit present.     Mental Status: He is alert and oriented to person, place, and time.     GCS: GCS eye subscore is 4. GCS verbal subscore is 5. GCS motor subscore is 6.     Cranial Nerves: Cranial nerves 2-12 are intact.     Sensory: Sensation is intact.     Motor: Motor function is intact.     Coordination: Coordination is intact.     Gait: Gait is intact.  Psychiatric:        Behavior: Behavior is cooperative.      UC Treatments / Results  Labs (all labs ordered are listed, but only abnormal results are displayed) Labs Reviewed - No data to display  EKG   Radiology No results found.  Procedures Procedures (including critical care time)  Medications Ordered in UC Medications - No data to display  Initial Impression / Assessment and Plan / UC Course  I have reviewed the triage vital signs and the nursing notes.  Pertinent labs & imaging results that were available during my care of the patient were reviewed by me and considered in my medical decision making (see chart for details).     Patient presented with dizziness since Saturday night, stating the room spins when laying down or bending over.  Upon assessment patient has impacted cerumen in bilateral ears.  Upon  reassessment after ear irrigation patient still has significant amount of earwax.  No other significant findings upon assessment.  No neurodeficits noted.  GCS 15.  Prescribed Debrox to help with impacted cerumen.  Prescribed meclizine as needed for dizziness.  Discussed follow-up, return, emergency department precautions. Final Clinical Impressions(s) / UC Diagnoses   Final diagnoses:  Benign paroxysmal positional vertigo  due to bilateral vestibular disorder  Impacted cerumen of both ears     Discharge Instructions      Use Debrox eardrops in both ears daily for 4 days to help break up earwax. You can take meclizine every 8 hours as needed for dizziness. This can make you drowsy so please do not drive or work while taking.  If your symptoms persist you can return here for reevaluation or follow-up with primary care provider.  If you develop severe headache, blurred vision, weakness, numbness, or confusion please seek immediate medical treatment in the ER.     ED Prescriptions     Medication Sig Dispense Auth. Provider   carbamide peroxide (DEBROX) 6.5 % OTIC solution Place 10 drops into both ears daily for 4 days. 2 mL Wynonia Lawman A, NP   meclizine (ANTIVERT) 25 MG tablet Take 1 tablet (25 mg total) by mouth 3 (three) times daily as needed for dizziness. 30 tablet Wynonia Lawman A, NP      PDMP not reviewed this encounter.   Wynonia Lawman A, NP 11/12/22 1032

## 2022-11-12 NOTE — ED Triage Notes (Signed)
Pt c/o dizziness since Saturday night. Pt states the room spins when laying down or bending over. Denies having headache

## 2022-11-29 NOTE — Plan of Care (Signed)
[facility] Tonsillectomy/Adenoidectomy, Postoperative PEDS care plan entered in error.

## 2023-02-09 ENCOUNTER — Other Ambulatory Visit: Payer: Self-pay | Admitting: Family Medicine

## 2023-03-09 ENCOUNTER — Other Ambulatory Visit: Payer: Self-pay | Admitting: Family Medicine

## 2023-04-10 ENCOUNTER — Other Ambulatory Visit: Payer: Self-pay | Admitting: Family Medicine

## 2023-05-04 ENCOUNTER — Other Ambulatory Visit: Payer: Self-pay | Admitting: Family Medicine

## 2023-05-06 ENCOUNTER — Other Ambulatory Visit (HOSPITAL_COMMUNITY): Payer: Self-pay

## 2023-05-08 ENCOUNTER — Other Ambulatory Visit (HOSPITAL_COMMUNITY): Payer: Self-pay

## 2023-05-29 ENCOUNTER — Other Ambulatory Visit: Payer: Self-pay | Admitting: Family Medicine

## 2023-06-21 ENCOUNTER — Other Ambulatory Visit: Payer: Self-pay | Admitting: Family Medicine

## 2023-07-16 ENCOUNTER — Ambulatory Visit: Admitting: Student

## 2023-07-16 ENCOUNTER — Encounter: Payer: Self-pay | Admitting: Student

## 2023-07-16 VITALS — BP 127/75 | HR 91 | Ht 70.0 in | Wt 350.4 lb

## 2023-07-16 DIAGNOSIS — J45901 Unspecified asthma with (acute) exacerbation: Secondary | ICD-10-CM

## 2023-07-16 DIAGNOSIS — J4530 Mild persistent asthma, uncomplicated: Secondary | ICD-10-CM | POA: Diagnosis not present

## 2023-07-16 MED ORDER — ALBUTEROL SULFATE HFA 108 (90 BASE) MCG/ACT IN AERS: 2.0000 | INHALATION_SPRAY | RESPIRATORY_TRACT | 0 refills | Status: DC | PRN

## 2023-07-16 MED ORDER — PREDNISONE 20 MG PO TABS
40.0000 mg | ORAL_TABLET | Freq: Every day | ORAL | 0 refills | Status: AC
Start: 2023-07-16 — End: 2023-07-21

## 2023-07-16 MED ORDER — IPRATROPIUM-ALBUTEROL 0.5-2.5 (3) MG/3ML IN SOLN
3.0000 mL | Freq: Once | RESPIRATORY_TRACT | Status: AC
Start: 2023-07-16 — End: 2023-07-16
  Administered 2023-07-16: 3 mL via RESPIRATORY_TRACT

## 2023-07-16 MED ORDER — FLUTICASONE-SALMETEROL 100-50 MCG/ACT IN AEPB
1.0000 | INHALATION_SPRAY | Freq: Two times a day (BID) | RESPIRATORY_TRACT | 3 refills | Status: DC
Start: 2023-07-16 — End: 2023-11-19

## 2023-07-16 MED ORDER — ALBUTEROL SULFATE (2.5 MG/3ML) 0.083% IN NEBU
2.5000 mg | INHALATION_SOLUTION | Freq: Once | RESPIRATORY_TRACT | Status: AC
Start: 2023-07-16 — End: 2023-07-16
  Administered 2023-07-16: 2.5 mg via RESPIRATORY_TRACT

## 2023-07-16 NOTE — Progress Notes (Signed)
    SUBJECTIVE:   CHIEF COMPLAINT / HPI:   Cough and wheezing For the past several days patient's had worsening cough, wheezing and shortness of breath with exertion.  Symptoms are worse in the morning.  Improved with albuterol .  He has been out of his Advair for an undefined amount of time 2/2 medication cost.  We discussed if medication is costly, we can attempt to work with our pharmacy to lower the cost for him.  No fevers, chills, NVD, chest pain, palpitations other systemic symptoms.  Non-smoker.   OBJECTIVE:   BP 127/75   Pulse 91   Ht 5\' 10"  (1.778 m)   Wt (!) 350 lb 6 oz (158.9 kg)   SpO2 96% Comment: after NEB Tx  BMI 50.27 kg/m    General: NAD, pleasant Cardio: RRR, no MRG Respiratory: Expiratory wheezing, slightly labored breathing.  Wheezing resolved and normal work of breathing on room air after DuoNeb treatment. Skin: Warm and dry  ASSESSMENT/PLAN:   Assessment & Plan Exacerbation of asthma, unspecified asthma severity, unspecified whether persistent Initial SpO2 of 88-90%, with significant expiratory wheezing on exam.  After DuoNeb treatment, SpO2 improved to 96% and wheezing improved.  Strongly suspect asthma exacerbation as the cause of his shortness of breath based on improvement of symptoms with treatment.  Lower concern, but cannot completely rule out cardiac causes-patient structured to return to ED for symptoms of chest pain, worsening exertional dyspnea. - S/p DuoNeb treatment - Prednisone burst - Refill Advair - Albuterol  4 puffs every 4 as needed  Lavada Porteous, DO [facility]

## 2023-07-16 NOTE — Patient Instructions (Addendum)
 It was great to see you! Thank you for allowing me to participate in your care!   I recommend that you always bring your medications to each appointment as this makes it easy to ensure we are on the correct medications and helps us  not miss when refills are needed.  Our plans for today:  - You can take 4 puffs of albuterol  every 4 hours as needed.  If you require 8 puffs at a time, I recommend you go to the emergency department. - I have sent prednisone, you will take 2 tablets every morning for 5 days. -I have refilled your Advair take 1 puff in the morning and 1 puff at night\ - Follow-up in 1 to 2 weeks   Take care and seek immediate care sooner if you develop any concerns. Please remember to show up 15 minutes before your scheduled appointment time!  Lavada Porteous, DO [facility]

## 2023-07-19 ENCOUNTER — Other Ambulatory Visit: Payer: Self-pay | Admitting: Family Medicine

## 2023-07-19 DIAGNOSIS — J45901 Unspecified asthma with (acute) exacerbation: Secondary | ICD-10-CM

## 2023-07-25 ENCOUNTER — Ambulatory Visit: Admitting: Family Medicine

## 2023-07-25 VITALS — BP 120/78 | HR 98 | Ht 71.0 in | Wt 348.6 lb

## 2023-07-25 DIAGNOSIS — J4530 Mild persistent asthma, uncomplicated: Secondary | ICD-10-CM | POA: Diagnosis not present

## 2023-07-25 DIAGNOSIS — R7303 Prediabetes: Secondary | ICD-10-CM | POA: Diagnosis not present

## 2023-07-25 LAB — POCT GLYCOSYLATED HEMOGLOBIN (HGB A1C): HbA1c, POC (controlled diabetic range): 6.5 % (ref 0.0–7.0)

## 2023-07-25 MED ORDER — FLUTICASONE PROPIONATE 50 MCG/ACT NA SUSP
2.0000 | Freq: Every day | NASAL | 6 refills | Status: AC
Start: 2023-07-25 — End: ?

## 2023-07-25 NOTE — Assessment & Plan Note (Addendum)
 Continue advair BID with albuterol  prn. Will prescribe flonase  given associated with allergies. Continue claritin daily. Discussed weight loss options given his symptoms, he will follow up in 1 month to discuss weight loss medications and other adjunctive therapies.

## 2023-07-25 NOTE — Patient Instructions (Addendum)
 It was wonderful to see you today.  Please bring ALL of your medications with you to every visit.   Today we talked about:  I'm glad you're feeling better! Continue taking a daily allergy medication with your inhaler. The advair inhaler is twice a day. I also started a new nasal spray that is once daily. The combination of the daily allergy medicine, the daily inhaler, and the nasal spray should help. Take your albuterol  as needed for symptoms.  At our next visit, lets discuss other weight loss options that may also help with your shortness of breath   Thank you for choosing [facility].   Please call [phone] with any questions about today's appointment.  Please arrive at least 15 minutes prior to your scheduled appointments.   If you had blood work today, I will send you a MyChart message or a letter if results are normal. Otherwise, I will give you a call.   If you had a referral placed, they will call you to set up an appointment. Please give us  a call if you don't hear back in the next 2 weeks.   If you need additional refills before your next appointment, please call your pharmacy first.   You should follow up in our clinic in [place] in about 4 weeks (around 08/22/2023).  Johnella Naas, MD Family Medicine

## 2023-07-25 NOTE — Assessment & Plan Note (Signed)
 A1C 6.5 today.

## 2023-07-25 NOTE — Progress Notes (Signed)
    SUBJECTIVE:   CHIEF COMPLAINT / HPI:   Feels like asthma has been worse this year. Thinks its due to allergies. Has not been feeling sick or had any sick symptoms, but does feel like his allergies are contributing. Is doing advair twice a day since seeing Dr. Telford Feather as last visit, was initially doing once a day. Finished his steroid course. Has not really used albuterol  on top of that. Tries to take Claritin for allergies.   PERTINENT  PMH / PSH:  Allergies Prediabetes HTN    OBJECTIVE:   BP 120/78   Pulse 98   Ht 5' 11 (1.803 m)   Wt (!) 348 lb 9.6 oz (158.1 kg)   SpO2 94%   BMI 48.62 kg/m   General: A&O, NAD, obese male  HEENT: No sign of trauma, EOM grossly intact Cardiac: RRR, no m/r/g Respiratory: CTAB, normal WOB, no w/c/r GI: obese, soft, NTTP, non-distended  Extremities: NTTP, mild bilateral LE edema  Neuro: Moves all four extremities appropriately. Psych: Appropriate mood and affect   ASSESSMENT/PLAN:   Assessment & Plan Prediabetes A1C 6.5 today.  Mild persistent asthma, unspecified whether complicated Continue advair BID with albuterol  prn. Will prescribe flonase  given associated with allergies. Continue claritin daily. Discussed weight loss options given his symptoms, he will follow up in 1 month to discuss weight loss medications and other adjunctive therapies.      Johnella Naas, MD [facility]

## 2023-08-05 ENCOUNTER — Other Ambulatory Visit: Payer: Self-pay | Admitting: Student

## 2023-08-05 DIAGNOSIS — J45901 Unspecified asthma with (acute) exacerbation: Secondary | ICD-10-CM

## 2023-08-17 ENCOUNTER — Other Ambulatory Visit: Payer: Self-pay | Admitting: Family Medicine

## 2023-08-17 DIAGNOSIS — N529 Male erectile dysfunction, unspecified: Secondary | ICD-10-CM

## 2023-08-22 ENCOUNTER — Other Ambulatory Visit: Payer: Self-pay | Admitting: Family Medicine

## 2023-08-22 DIAGNOSIS — I10 Essential (primary) hypertension: Secondary | ICD-10-CM

## 2023-09-08 ENCOUNTER — Other Ambulatory Visit: Payer: Self-pay | Admitting: Family Medicine

## 2023-09-08 DIAGNOSIS — R7303 Prediabetes: Secondary | ICD-10-CM

## 2023-09-13 ENCOUNTER — Encounter: Payer: Self-pay | Admitting: Family Medicine

## 2023-09-13 DIAGNOSIS — R7303 Prediabetes: Secondary | ICD-10-CM

## 2023-09-14 MED ORDER — METFORMIN HCL 500 MG PO TABS: 500.0000 mg | ORAL_TABLET | Freq: Two times a day (BID) | ORAL | 1 refills | Status: AC

## 2023-09-19 ENCOUNTER — Other Ambulatory Visit: Payer: Self-pay | Admitting: Family Medicine

## 2023-09-19 DIAGNOSIS — I10 Essential (primary) hypertension: Secondary | ICD-10-CM

## 2023-09-22 ENCOUNTER — Other Ambulatory Visit: Payer: Self-pay | Admitting: Family Medicine

## 2023-09-22 DIAGNOSIS — J45901 Unspecified asthma with (acute) exacerbation: Secondary | ICD-10-CM

## 2023-11-08 ENCOUNTER — Ambulatory Visit: Admitting: Family Medicine

## 2023-11-08 VITALS — BP 132/84 | HR 99 | Ht 71.0 in | Wt 337.4 lb

## 2023-11-08 DIAGNOSIS — L600 Ingrowing nail: Secondary | ICD-10-CM

## 2023-11-08 NOTE — Progress Notes (Signed)
    SUBJECTIVE:   CHIEF COMPLAINT / HPI:   Ingrown toenails -- Bilateral great toe with ingrown toenails - Patient denies pain -- Has had ingrown toenails before, tries to manage himself by cutting his toenails though it is difficult for him to do this -- Does not have a podiatrist  Declines flu and COVID vaccines today  PERTINENT  PMH / PSH: Hypertension, asthma, HLD, obesity  OBJECTIVE:   BP 132/84   Pulse 99   Ht 5' 11 (1.803 m)   Wt (!) 337 lb 6.4 oz (153 kg)   SpO2 91%   BMI 47.06 kg/m   - General: No acute distress. Awake and conversant. - Eyes: Normal conjunctiva, anicteric. Round symmetric pupils. - ENT: Hearing grossly intact. No nasal discharge. - Respiratory: Respirations are non-labored on room air - Psych: Alert and oriented. Cooperative, Appropriate mood and affect, Normal judgment. - Neuro: Sensation and CN II-XII grossly normal. - Feet: Bilateral great toes with fungal disease and ingrown nails, no erythema or signs of infection   ASSESSMENT/PLAN:   Assessment & Plan Ingrown toenail of both feet Patient with noninfected, nontender ingrown toenails on both great toes.  Scheduled for follow-up appointment to address the ingrown nails as there would not be sufficient time to do the procedure today.  Sent referral to podiatry as patient will likely need long-term footcare moving forward     Rea Raring, MD [facility]

## 2023-11-08 NOTE — Patient Instructions (Addendum)
 Thank you for coming in today! Here is a summary of what we discussed:  -We will do our best to take care of one of your ingrown toenails next Friday. Please arrive by 115 so we have plenty of time for the procedure. If you are late we may not be able to do the procedure.  -I sent a referral for a podiatrist. You should hear from them in the next 1-2 weeks  Please call the clinic at [phone] if your symptoms worsen or you have any concerns.  Best, Dr Adele

## 2023-11-15 ENCOUNTER — Ambulatory Visit: Payer: Self-pay

## 2023-11-17 ENCOUNTER — Other Ambulatory Visit: Payer: Self-pay | Admitting: Student

## 2023-11-17 DIAGNOSIS — J4530 Mild persistent asthma, uncomplicated: Secondary | ICD-10-CM

## 2023-11-17 DIAGNOSIS — J45901 Unspecified asthma with (acute) exacerbation: Secondary | ICD-10-CM

## 2023-11-19 ENCOUNTER — Ambulatory Visit (INDEPENDENT_AMBULATORY_CARE_PROVIDER_SITE_OTHER): Admitting: Podiatry

## 2023-11-19 ENCOUNTER — Encounter: Payer: Self-pay | Admitting: Podiatry

## 2023-11-19 DIAGNOSIS — L603 Nail dystrophy: Secondary | ICD-10-CM

## 2023-11-19 NOTE — Progress Notes (Signed)
 Subjective:  Patient ID: Bryan Simmons, male    DOB: 1965-07-28,  MRN: [id number] HPI Chief Complaint  Patient presents with   Toe Pain    Hallux nails bilateral (L>R) - left is tender when nail is long, right is tender occasionally, toenails are long and thick   New Patient (Initial Visit)    58 y.o. male presents with the above complaint.   ROS: Denies fever chills nausea muscle aches pains calf pain back pain chest pain shortness of breath  Past Medical History:  Diagnosis Date   Diabetes mellitus without complication (HCC)    Hypertension    Past Surgical History:  Procedure Laterality Date   REPLACEMENT TOTAL KNEE Right     Current Outpatient Medications:    famotidine  (PEPCID ) 20 MG tablet, Take 20 mg by mouth., Disp: , Rfl:    albuterol  (VENTOLIN  HFA) 108 (90 Base) MCG/ACT inhaler, INHALE 2 PUFFS BY MOUTH EVERY 4 HOURS AS NEEDED FOR WHEEZING FOR SHORTNESS OF BREATH, Disp: 9 g, Rfl: 0   aspirin EC 81 MG tablet, Take 81 mg by mouth daily. Swallow whole., Disp: , Rfl:    fluticasone  (FLONASE ) 50 MCG/ACT nasal spray, Place 2 sprays into both nostrils daily., Disp: 16 g, Rfl: 6   fluticasone -salmeterol (ADVAIR) 100-50 MCG/ACT AEPB, INHALE 1 DOSE BY MOUTH TWICE DAILY, Disp: 60 each, Rfl: 0   hydrochlorothiazide  (HYDRODIURIL ) 25 MG tablet, Take 1 tablet by mouth once daily, Disp: 90 tablet, Rfl: 0   meclizine  (ANTIVERT ) 25 MG tablet, Take 1 tablet (25 mg total) by mouth 3 (three) times daily as needed for dizziness., Disp: 30 tablet, Rfl: 0   metFORMIN  (GLUCOPHAGE ) 500 MG tablet, Take 1 tablet (500 mg total) by mouth 2 (two) times daily with a meal., Disp: 60 tablet, Rfl: 1   sildenafil  (VIAGRA ) 100 MG tablet, TAKE 1/2 TO 1 (ONE-HALF TO ONE) TABLET BY MOUTH ONCE DAILY AS NEEDED FOR ERECTILE DYSFUNCTION, Disp: 30 tablet, Rfl: 0  Allergies  Allergen Reactions   Lisinopril  Other (See Comments)    Patient felt dizzy when lying down while taking this medication   Review of  Systems Objective:  There were no vitals filed for this visit.  General: Well developed, nourished, in no acute distress, alert and oriented x3   Dermatological: Skin is warm, dry and supple bilateral. Nails x 10 are well maintained; remaining integument appears unremarkable at this time. There are no open sores, no preulcerative lesions, tinea pedis is exhibited bilaterally throughout the plantar aspect of the foot in a moccasin type distribution he also has thick mycotic nails with sharp incurvated nail margins on the tibial-fibular border of the hallux bilaterally left greater than right.  Vascular: Dorsalis Pedis artery and Posterior Tibial artery pedal pulses are 2/4 bilateral with immedate capillary fill time.  There is no pedal hair growth he has moderate edema bilateral pitting in nature.  Capillary fill time is immediate feet are warm to the touch.  Neruologic: Grossly intact via light touch bilateral. Vibratory intact via tuning fork bilateral. Protective threshold with Semmes Wienstein monofilament intact to all pedal sites bilateral. Patellar and Achilles deep tendon reflexes 2+ bilateral. No Babinski or clonus noted bilateral.   Musculoskeletal: No gross boney pedal deformities bilateral. No pain, crepitus, or limitation noted with foot and ankle range of motion bilateral. Muscular strength 5/5 in all groups tested bilateral.  Gait: Unassisted, Nonantalgic.    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  Plan: Samples of the skin and nail were taken today for pathologic evaluation follow-up with him in 1 month     Casie Sturgeon T. Jackson, [place]

## 2023-11-26 ENCOUNTER — Other Ambulatory Visit: Payer: Self-pay | Admitting: Family Medicine

## 2023-11-26 DIAGNOSIS — J45901 Unspecified asthma with (acute) exacerbation: Secondary | ICD-10-CM

## 2023-11-29 ENCOUNTER — Ambulatory Visit: Payer: Self-pay | Admitting: Family Medicine

## 2023-11-29 ENCOUNTER — Ambulatory Visit: Admitting: Family Medicine

## 2023-11-29 VITALS — BP 124/78 | Ht 71.0 in | Wt 333.0 lb

## 2023-11-29 DIAGNOSIS — M1710 Unilateral primary osteoarthritis, unspecified knee: Secondary | ICD-10-CM | POA: Diagnosis not present

## 2023-11-29 DIAGNOSIS — M7502 Adhesive capsulitis of left shoulder: Secondary | ICD-10-CM | POA: Diagnosis not present

## 2023-11-29 MED ORDER — METHYLPREDNISOLONE ACETATE 40 MG/ML IJ SUSP
40.0000 mg | Freq: Once | INTRAMUSCULAR | Status: AC
Start: 2023-11-29 — End: 2023-11-29
  Administered 2023-11-29: 40 mg via INTRA_ARTICULAR

## 2023-11-29 NOTE — Patient Instructions (Signed)
  Please make an appointment to see us  back in 2-3 weeks. Please try to do the shoulder exercises twice a day every day until we see you back.    Today you received an injection with corticosteroid. This injection is usually done in response to pain and inflammation. There is some numbing medicine also in the shot so the injected area may be numb and feel really good for the next couple of hours. The numbing medicine usually wears off in 2-3 hours though, and then your pain level will be right back where it was before the injection.   The actually benefit from the steroid injection is usually noticed in 2-7 days. You may actually experience a small (as in 10%) INCREASE in pain in the first 24 hours---that is common.   Things to watch out for that you should contact us  or a health care provider urgently would include: 1. Unusual (as in more than 10%) increase in pain 2. New fever > 101.5 3. New swelling or redness of the injected area.  4. Streaking of red lines around the area injected.

## 2023-11-30 DIAGNOSIS — M1710 Unilateral primary osteoarthritis, unspecified knee: Secondary | ICD-10-CM | POA: Insufficient documentation

## 2023-11-30 DIAGNOSIS — M7502 Adhesive capsulitis of left shoulder: Secondary | ICD-10-CM | POA: Insufficient documentation

## 2023-11-30 NOTE — Progress Notes (Signed)
 NOACH CALVILLO - 58 y.o. male MRN [id number]  Date of birth: [birth year]    SUBJECTIVE:      Chief Complaint:/ HPI:  #1.  Left knee pain: Has previously had corticosteroid injections which helped.  He has noticed that the last 1 he had did not last very long, probably several months but the first ones he had lasted greater than a year.  He is status post right TKR.  He knows eventually he will have to have a total knee replacement on the left but wants to put it off as long as possible.  Wants to consider corticosteroid injections today. 2.  Left shoulder pain.  Also stiff.  He has noticed this increasing in severity over the last 6 to 7 months, much worse in the last 2 to 3 weeks.  Causes difficulty at night trying to sleep as it is aching.  Seems to have some limited range of motion in overhead activities.  No prior injury.  He is right-hand dominant.  PERTINENT  PMH / PSH: I have reviewed the patient's medications, allergies, past medical and surgical history, smoking status.  Pertinent findings that relate to today's visit / issues include: X-ray of right shoulder 12th, 2023 showed some glenohumeral joint space narrowing and some osteophytes as well as some acromioclavicular joint arthropathy.  We have no x-ray imaging of his left shoulder. 2.  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  I reviewed these films with him and it shows bone-on-bone medial compartment arthropathy.2 to right knee total knee arthroplasty  OBJECTIVE: BP 124/78   Ht 5' 11 (1.803 m)   Wt (!) 333 lb (151 kg)   BMI 46.44 kg/m   Physical Exam:  Vital signs are reviewed. General: Well-developed no acute distress BMI 46 Shoulders: Symmetrical.  Right shoulder has full range of motion.  Left shoulder somewhat stiff with passive range of motion above 90 degrees and abduction.  Also stiff and forward flexion above 100 degrees.  This is also  slightly painful for him.  He is tender to palpation over the acromioclavicular joint.  Significant decreased in internal rotation on the left side compared with the right, he cannot get his hand behind his left hip.  He has full strength in rotator cuff planes muscularly. He has intact sensation to soft touch bilaterally in his hands and feet. Neck: Full Range Of Motion.  Negative Spurling's.  PROCEDURE: INJECTION: Patient was given informed consent, signed copy in the chart. Appropriate time out was taken. Area prepped and draped in usual sterile fashion. Ethyl chloride was  used for local anesthesia. A 21 gauge 1 1/2 inch needle was used..  1 cc of methylprednisolone  40 mg/ml plus 4 cc of 1% lidocaine without epinephrine was injected into the left subacromial bursa using a(n) posterior approach.   The patient tolerated the procedure well. There were no complications. Post procedure instructions were given. PROCEDURE: INJECTION: Patient was given informed consent, signed copy in the chart. Appropriate time out was taken. Area prepped and draped in usual sterile fashion. Ethyl chloride was  used for local anesthesia. A 21 gauge 1 1/2 inch needle was used..  1 cc of methylprednisolone  40 mg/ml plus 4 cc of 1% lidocaine without epinephrine was injected into the left knee using a(n) anterior medial approach.   The patient tolerated the procedure well. There were no complications. Post procedure instructions were given.   ASSESSMENT & PLAN: #1.  Left knee arthritis.  After review of his prior x-ray, I do not think further imaging at this time will be beneficial.  He is clearly on his way to a total knee replacement.  He like to put that off as long as possible but I did urge him to consider getting an appointment with his orthopedist so they could preplan timing.  Given BMI of 46, he may need some preoperative weight loss -  Today we injected that knee with corticosteroid which I hope will give him  some relief. 2.  Left shoulder pain.  I think this is multifactorial and I am concerned he is developing adhesive capsulitis.  There is likely a component of rotator cuff impingement as well as acromioclavicular joint arthritis.  I gave him home exercise program for range of motion, pendulum exercises, wall crawls.   - Will try subacromial bursa injection today and have him follow-up in 2 to 3 weeks.  If he is not having improvement in stiffness, would consider ultrasound-guided glenohumeral joint injection and we discussed this.  - At next visit would also consider ultrasound of his acromioclavicular joint. See problem based charting & AVS for pt instructions. No problem-specific Assessment & Plan notes found for this encounter.

## 2023-12-20 ENCOUNTER — Other Ambulatory Visit: Payer: Self-pay | Admitting: Family Medicine

## 2023-12-20 DIAGNOSIS — J45901 Unspecified asthma with (acute) exacerbation: Secondary | ICD-10-CM

## 2023-12-20 DIAGNOSIS — I10 Essential (primary) hypertension: Secondary | ICD-10-CM

## 2023-12-20 DIAGNOSIS — J4530 Mild persistent asthma, uncomplicated: Secondary | ICD-10-CM

## 2023-12-26 ENCOUNTER — Encounter: Payer: Self-pay | Admitting: Podiatry

## 2023-12-26 ENCOUNTER — Ambulatory Visit: Admitting: Podiatry

## 2023-12-26 DIAGNOSIS — L603 Nail dystrophy: Secondary | ICD-10-CM | POA: Diagnosis not present

## 2023-12-26 MED ORDER — ITRACONAZOLE 100 MG PO CAPS: 100.0000 mg | ORAL_CAPSULE | Freq: Two times a day (BID) | ORAL | 0 refills | Status: DC

## 2023-12-30 ENCOUNTER — Other Ambulatory Visit: Payer: Self-pay | Admitting: Family Medicine

## 2023-12-30 DIAGNOSIS — J45901 Unspecified asthma with (acute) exacerbation: Secondary | ICD-10-CM

## 2023-12-30 NOTE — Progress Notes (Signed)
 He presents today for follow-up of his nail pathology.  He states that there is no change of his nails.  Objective: No pathology does demonstrate 2 types of yeast as opposed to any dermatophyte.  Assessment: Onychomycosis with yeast infection.  Plan: Discussed etiology pathology conservative surgical therapies at this point we will start him on Sporanox  100 mg twice daily it has a 90 to 100% efficacy for these 2 organisms.  We did discuss the pros and cons of this medication and the possible side effects and complications associated with that he understands that and is amendable to it we will follow-up with me in 1 month for blood work.

## 2024-01-18 ENCOUNTER — Other Ambulatory Visit: Payer: Self-pay | Admitting: Family Medicine

## 2024-01-18 DIAGNOSIS — J45901 Unspecified asthma with (acute) exacerbation: Secondary | ICD-10-CM

## 2024-01-18 DIAGNOSIS — J4530 Mild persistent asthma, uncomplicated: Secondary | ICD-10-CM

## 2024-01-23 ENCOUNTER — Ambulatory Visit: Admitting: Podiatry

## 2024-02-04 ENCOUNTER — Ambulatory Visit: Admitting: Podiatry

## 2024-02-16 ENCOUNTER — Other Ambulatory Visit: Payer: Self-pay | Admitting: Family Medicine

## 2024-02-16 DIAGNOSIS — J4530 Mild persistent asthma, uncomplicated: Secondary | ICD-10-CM

## 2024-02-16 DIAGNOSIS — J45901 Unspecified asthma with (acute) exacerbation: Secondary | ICD-10-CM

## 2024-02-18 ENCOUNTER — Ambulatory Visit: Admitting: Podiatry

## 2024-02-18 DIAGNOSIS — Z79899 Other long term (current) drug therapy: Secondary | ICD-10-CM | POA: Diagnosis not present

## 2024-02-18 DIAGNOSIS — L603 Nail dystrophy: Secondary | ICD-10-CM

## 2024-02-18 MED ORDER — ITRACONAZOLE 100 MG PO CAPS: 100.0000 mg | ORAL_CAPSULE | Freq: Two times a day (BID) | ORAL | 0 refills | Status: AC

## 2024-02-18 NOTE — Progress Notes (Signed)
 He presents today for follow-up of his Sporanox  therapy.  He has been on it for 30 days no side effects.  States he is seeing no changes in his nails as of yet.  Objective: No change in physical exam.  Assessment: Long-term therapy with Sporanox .  Plan: Provided him with another 90 days of Sporanox  2 tablets daily.  Also requested a comprehensive metabolic panel and I will follow-up with him in 4 months

## 2024-02-26 LAB — COMPREHENSIVE METABOLIC PANEL WITH GFR
AG Ratio: 1.3 (calc) (ref 1.0–2.5)
ALT: 19 U/L (ref 9–46)
AST: 14 U/L (ref 10–35)
Albumin: 4 g/dL (ref 3.6–5.1)
Alkaline phosphatase (APISO): 57 U/L (ref 35–144)
BUN: 18 mg/dL (ref 7–25)
CO2: 33 mmol/L — ABNORMAL HIGH (ref 20–32)
Calcium: 9.3 mg/dL (ref 8.6–10.3)
Chloride: 97 mmol/L — ABNORMAL LOW (ref 98–110)
Creat: 0.89 mg/dL (ref 0.70–1.30)
Globulin: 3.2 g/dL (ref 1.9–3.7)
Glucose, Bld: 109 mg/dL (ref 65–139)
Potassium: 4.2 mmol/L (ref 3.5–5.3)
Sodium: 139 mmol/L (ref 135–146)
Total Bilirubin: 0.4 mg/dL (ref 0.2–1.2)
Total Protein: 7.2 g/dL (ref 6.1–8.1)
eGFR: 99 mL/min/1.73m2

## 2024-03-03 ENCOUNTER — Other Ambulatory Visit: Payer: Self-pay | Admitting: Family Medicine

## 2024-03-03 DIAGNOSIS — N529 Male erectile dysfunction, unspecified: Secondary | ICD-10-CM

## 2024-03-03 DIAGNOSIS — J45901 Unspecified asthma with (acute) exacerbation: Secondary | ICD-10-CM

## 2024-03-11 ENCOUNTER — Ambulatory Visit: Payer: Self-pay | Admitting: Podiatry

## 2024-06-18 ENCOUNTER — Ambulatory Visit: Admitting: Podiatry
